# Patient Record
Sex: Female | Born: 1990 | Race: White | Hispanic: No | Marital: Single | Smoking: Current every day smoker
Health system: Southern US, Community
[De-identification: ages and names within clinical notes are randomized; demographics above are authoritative.]

## PROBLEM LIST (undated history)

## (undated) DIAGNOSIS — F32A Depression, unspecified: Secondary | ICD-10-CM

## (undated) DIAGNOSIS — N200 Calculus of kidney: Secondary | ICD-10-CM

## (undated) DIAGNOSIS — E669 Obesity, unspecified: Secondary | ICD-10-CM

## (undated) DIAGNOSIS — F329 Major depressive disorder, single episode, unspecified: Secondary | ICD-10-CM

## (undated) DIAGNOSIS — F419 Anxiety disorder, unspecified: Secondary | ICD-10-CM

## (undated) HISTORY — PX: CHOLECYSTECTOMY: SHX55

## (undated) HISTORY — PX: TONSILLECTOMY: SUR1361

## (undated) HISTORY — PX: ANKLE ARTHROPLASTY: SUR68

---

## 2015-09-23 ENCOUNTER — Encounter (HOSPITAL_BASED_OUTPATIENT_CLINIC_OR_DEPARTMENT_OTHER): Payer: Self-pay | Admitting: *Deleted

## 2015-09-23 DIAGNOSIS — M545 Low back pain: Secondary | ICD-10-CM | POA: Diagnosis present

## 2015-09-23 DIAGNOSIS — F17210 Nicotine dependence, cigarettes, uncomplicated: Secondary | ICD-10-CM | POA: Diagnosis not present

## 2015-09-23 DIAGNOSIS — N390 Urinary tract infection, site not specified: Secondary | ICD-10-CM | POA: Insufficient documentation

## 2015-09-23 NOTE — ED Notes (Signed)
Lower back pain since hiking a few days ago.  Ambulatory.  Increased pain with movement.

## 2015-09-24 ENCOUNTER — Encounter (HOSPITAL_BASED_OUTPATIENT_CLINIC_OR_DEPARTMENT_OTHER): Payer: Self-pay | Admitting: Emergency Medicine

## 2015-09-24 ENCOUNTER — Emergency Department (HOSPITAL_BASED_OUTPATIENT_CLINIC_OR_DEPARTMENT_OTHER)
Admission: EM | Admit: 2015-09-24 | Discharge: 2015-09-24 | Disposition: A | Discharge status: Home / Self Care | Payer: Medicaid Other | Attending: Emergency Medicine | Admitting: Emergency Medicine

## 2015-09-24 DIAGNOSIS — N390 Urinary tract infection, site not specified: Secondary | ICD-10-CM

## 2015-09-24 HISTORY — DX: Calculus of kidney: N20.0

## 2015-09-24 LAB — URINE MICROSCOPIC-ADD ON: RBC / HPF: NONE SEEN RBC/hpf (ref 0–5)

## 2015-09-24 LAB — URINALYSIS, ROUTINE W REFLEX MICROSCOPIC
BILIRUBIN URINE: NEGATIVE
GLUCOSE, UA: NEGATIVE mg/dL
HGB URINE DIPSTICK: NEGATIVE
Ketones, ur: NEGATIVE mg/dL
Nitrite: POSITIVE — AB
PROTEIN: NEGATIVE mg/dL
Specific Gravity, Urine: 1.022 (ref 1.005–1.030)
pH: 6.5 (ref 5.0–8.0)

## 2015-09-24 LAB — PREGNANCY, URINE: Preg Test, Ur: NEGATIVE

## 2015-09-24 MED ORDER — NAPROXEN 250 MG PO TABS
500.0000 mg | ORAL_TABLET | Freq: Once | ORAL | Status: AC
  Administered 2015-09-24: 500 mg via ORAL
  Filled 2015-09-24: qty 2

## 2015-09-24 MED ORDER — NAPROXEN 375 MG PO TABS: 375.0000 mg | ORAL_TABLET | Freq: Two times a day (BID) | ORAL | Status: DC

## 2015-09-24 MED ORDER — PHENAZOPYRIDINE HCL 100 MG PO TABS
200.0000 mg | ORAL_TABLET | Freq: Three times a day (TID) | ORAL | Status: DC
  Administered 2015-09-24: 200 mg via ORAL
  Filled 2015-09-24 (×2): qty 2

## 2015-09-24 MED ORDER — CEPHALEXIN 500 MG PO CAPS: 500.0000 mg | ORAL_CAPSULE | Freq: Four times a day (QID) | ORAL | Status: DC

## 2015-09-24 MED ORDER — PHENAZOPYRIDINE HCL 200 MG PO TABS: 200.0000 mg | ORAL_TABLET | Freq: Three times a day (TID) | ORAL | Status: DC

## 2015-09-24 NOTE — ED Notes (Signed)
Pt describes low back pain "dull throb" off and on for about a week. Worse past few days. +nausea

## 2015-09-24 NOTE — ED Notes (Signed)
Information given to patient regarding primary care options.

## 2015-09-24 NOTE — ED Provider Notes (Signed)
CSN: [id number]     Arrival date & time 09/23/15  2316 History   First MD Initiated Contact with Patient 09/24/15 0225     Chief Complaint  Patient presents with  . Back Pain     (Consider location/radiation/quality/duration/timing/severity/associated sxs/prior Treatment) Patient is a 25 y.o. female presenting with back pain. The history is provided by the patient.  Back Pain Location:  Sacro-iliac joint Quality:  Aching Radiates to:  Does not radiate Pain severity:  Moderate Pain is:  Same all the time Onset quality:  Gradual Timing:  Constant Progression:  Unchanged Chronicity:  New Context: not MCA, not MVA, not recent illness and not recent injury   Relieved by:  Nothing Worsened by:  Nothing tried Ineffective treatments:  None tried Associated symptoms: no abdominal pain, no abdominal swelling, no bladder incontinence, no bowel incontinence, no chest pain, no dysuria, no fever, no headaches, no leg pain, no numbness, no paresthesias, no pelvic pain, no perianal numbness, no tingling, no weakness and no weight loss   Risk factors: no hx of cancer     Past Medical History  Diagnosis Date  . Kidney stone    Past Surgical History  Procedure Laterality Date  . Tonsillectomy    . Cholecystectomy    . Cesarean section     History reviewed. No pertinent family history. Social History  Substance Use Topics  . Smoking status: Current Every Day Smoker -- 0.50 packs/day    Types: Cigarettes  . Smokeless tobacco: None  . Alcohol Use: No   OB History    No data available     Review of Systems  Constitutional: Negative for fever and weight loss.  Cardiovascular: Negative for chest pain.  Gastrointestinal: Negative for abdominal pain and bowel incontinence.  Genitourinary: Negative for bladder incontinence, dysuria and pelvic pain.  Musculoskeletal: Positive for back pain.  Neurological: Negative for tingling, weakness, numbness, headaches and paresthesias.  All other  systems reviewed and are negative.     Allergies  Review of patient's allergies indicates no known allergies.  Home Medications   Prior to Admission medications   Not on File   BP 96/53 mmHg  Pulse 72  Temp(Src) 98.1 F (36.7 C) (Oral)  Resp 18  Ht 5\' 5"  (1.651 m)  Wt 280 lb (127.007 kg)  BMI 46.59 kg/m2  SpO2 100%  LMP 08/26/2015 Physical Exam  Constitutional: She is oriented to person, place, and time. She appears well-developed and well-nourished. No distress.  HENT:  Head: Normocephalic and atraumatic.  Mouth/Throat: Oropharynx is clear and moist.  Eyes: Conjunctivae are normal. Pupils are equal, round, and reactive to light.  Neck: Normal range of motion. Neck supple.  Cardiovascular: Normal rate, regular rhythm and intact distal pulses.   Pulmonary/Chest: Effort normal and breath sounds normal. No respiratory distress. She has no wheezes. She has no rales.  Abdominal: Soft. Bowel sounds are normal. There is no tenderness. There is no rebound and no guarding.  Musculoskeletal: Normal range of motion.  Neurological: She is alert and oriented to person, place, and time.  Skin: Skin is warm and dry.  Psychiatric: She has a normal mood and affect.    ED Course  Procedures (including critical care time) Labs Review Labs Reviewed  URINALYSIS, ROUTINE W REFLEX MICROSCOPIC (NOT AT [facility]) - Abnormal; Notable for the following:    APPearance CLOUDY (*)    Nitrite POSITIVE (*)    Leukocytes, UA LARGE (*)    All other components within normal limits  URINE MICROSCOPIC-ADD ON - Abnormal; Notable for the following:    Squamous Epithelial / LPF 6-30 (*)    Bacteria, UA MANY (*)    All other components within normal limits  PREGNANCY, URINE    Imaging Review No results found. I have personally reviewed and evaluated these images and lab results as part of my medical decision-making.   EKG Interpretation None      MDM   Final diagnoses:  UTI (lower urinary tract  infection)   Filed Vitals:   09/24/15 0214 09/24/15 0240  BP: 93/41 96/53  Pulse: 70 72  Temp: 98.1 F (36.7 C) 98.1 F (36.7 C)  Resp: 18 18    Results for orders placed or performed during the hospital encounter of 09/24/15  Urinalysis, Routine w reflex microscopic (not at [facility])  Result Value Ref Range   Color, Urine YELLOW YELLOW   APPearance CLOUDY (A) CLEAR   Specific Gravity, Urine 1.022 1.005 - 1.030   pH 6.5 5.0 - 8.0   Glucose, UA NEGATIVE NEGATIVE mg/dL   Hgb urine dipstick NEGATIVE NEGATIVE   Bilirubin Urine NEGATIVE NEGATIVE   Ketones, ur NEGATIVE NEGATIVE mg/dL   Protein, ur NEGATIVE NEGATIVE mg/dL   Nitrite POSITIVE (A) NEGATIVE   Leukocytes, UA LARGE (A) NEGATIVE  Pregnancy, urine  Result Value Ref Range   Preg Test, Ur NEGATIVE NEGATIVE  Urine microscopic-add on  Result Value Ref Range   Squamous Epithelial / LPF 6-30 (A) NONE SEEN   WBC, UA 6-30 0 - 5 WBC/hpf   RBC / HPF NONE SEEN 0 - 5 RBC/hpf   Bacteria, UA MANY (A) NONE SEEN   No results found.  Medications  phenazopyridine (PYRIDIUM) tablet 200 mg (200 mg Oral Given 09/24/15 0246)  naproxen (NAPROSYN) tablet 500 mg (not administered)    UTI:  Stable for discharge at this time with close follow up.  Strict return precautions given      Alayla Dethlefs, MD 09/24/15 [phone]

## 2016-12-26 ENCOUNTER — Encounter (HOSPITAL_COMMUNITY): Payer: Self-pay | Admitting: *Deleted

## 2016-12-26 ENCOUNTER — Emergency Department (HOSPITAL_COMMUNITY)
Admission: EM | Admit: 2016-12-26 | Discharge: 2016-12-26 | Disposition: A | Discharge status: Home / Self Care | Payer: Medicaid Other | Attending: Emergency Medicine | Admitting: Emergency Medicine

## 2016-12-26 ENCOUNTER — Emergency Department (HOSPITAL_COMMUNITY): Payer: Medicaid Other

## 2016-12-26 DIAGNOSIS — M25572 Pain in left ankle and joints of left foot: Secondary | ICD-10-CM | POA: Insufficient documentation

## 2016-12-26 DIAGNOSIS — R600 Localized edema: Secondary | ICD-10-CM | POA: Diagnosis not present

## 2016-12-26 DIAGNOSIS — Z96662 Presence of left artificial ankle joint: Secondary | ICD-10-CM | POA: Insufficient documentation

## 2016-12-26 DIAGNOSIS — Z79899 Other long term (current) drug therapy: Secondary | ICD-10-CM | POA: Insufficient documentation

## 2016-12-26 DIAGNOSIS — G8929 Other chronic pain: Secondary | ICD-10-CM | POA: Diagnosis not present

## 2016-12-26 DIAGNOSIS — F17210 Nicotine dependence, cigarettes, uncomplicated: Secondary | ICD-10-CM | POA: Diagnosis not present

## 2016-12-26 MED ORDER — MELOXICAM 15 MG PO TABS: 15.0000 mg | ORAL_TABLET | Freq: Every day | ORAL | 0 refills | Status: DC

## 2016-12-26 MED ORDER — IBUPROFEN 200 MG PO TABS
600.0000 mg | ORAL_TABLET | Freq: Once | ORAL | Status: AC
  Administered 2016-12-26: 600 mg via ORAL
  Filled 2016-12-26: qty 1

## 2016-12-26 NOTE — Discharge Instructions (Signed)
Please read attached information regarding your condition, rice therapy and ankle range of motion exercises. Weight-bear as tolerated. Use ankle brace as directed. Return to ED for worsening pain, increased swelling of joint, redness of the joint, fevers, numbness, injury or falls.

## 2016-12-26 NOTE — Progress Notes (Signed)
Orthopedic Tech Progress Note Patient Details:  Lenox PondsKendre Spieler 08/24/1990 [id number]  Ortho Devices Type of Ortho Device: ASO Ortho Device/Splint Location: applied ASO to pt left ankle/foot.  pt tolerated application well.  Left foot.  Ortho Device/Splint Interventions: Application, Adjustment   Alvina ChouWilliams, Josph Norfleet C 12/26/2016, 9:24 PM

## 2016-12-26 NOTE — ED Provider Notes (Signed)
[facility]-EMERGENCY DEPT Provider Note   CSN: [id number] Arrival date & time: 12/26/16  1726     History   Chief Complaint No chief complaint on file.   HPI Kelly Webb is a 26 y.o. female.  HPI Patient, with a past medical history of left ankle fracture requiring surgery 11 months ago, presents to ED for left ankle pain for the past day. She states that her ankle has been giving her pain on and off since the surgery but it has worsened today. She is unsure if this is related to running around at work longer than usual yesterday. She has tried ibuprofen once with no relief in her symptoms. She has been walking with a walker which she has to use intermittently since the surgery. She denies any injury, falls, fever, chills, redness of the joint, numbness.   Past Medical History:  Diagnosis Date  . Kidney stone     There are no active problems to display for this patient.   Past Surgical History:  Procedure Laterality Date  . ANKLE ARTHROPLASTY Left   . CESAREAN SECTION    . CHOLECYSTECTOMY    . TONSILLECTOMY      OB History    No data available       Home Medications    Prior to Admission medications   Medication Sig Start Date End Date Taking? Authorizing Provider  cephALEXin (KEFLEX) 500 MG capsule Take 1 capsule (500 mg total) by mouth 4 (four) times daily. 09/24/15   Palumbo, April, MD  meloxicam (MOBIC) 15 MG tablet Take 1 tablet (15 mg total) by mouth daily. 12/26/16   Quinten Allerton, PA-C  naproxen (NAPROSYN) 375 MG tablet Take 1 tablet (375 mg total) by mouth 2 (two) times daily. 09/24/15   Palumbo, April, MD  phenazopyridine (PYRIDIUM) 200 MG tablet Take 1 tablet (200 mg total) by mouth 3 (three) times daily. 09/24/15   Palumbo, April, MD    Family History No family history on file.  Social History Social History  Substance Use Topics  . Smoking status: Current Every Day Smoker    Packs/day: 0.50    Types: Cigarettes  . Smokeless tobacco: Not on file  . Alcohol  use No     Allergies   Patient has no known allergies.   Review of Systems Review of Systems  Constitutional: Negative for chills and fever.  Musculoskeletal: Positive for arthralgias and joint swelling. Negative for back pain, gait problem and myalgias.  Skin: Negative for color change, rash and wound.  Neurological: Negative for numbness.     Physical Exam Updated Vital Signs BP 111/76 (BP Location: Left Arm)   Pulse 93   Temp 97.8 F (36.6 C) (Oral)   Resp 14   LMP 12/11/2016 (Approximate)   SpO2 97%   Physical Exam  Constitutional: She appears well-developed and well-nourished. No distress.  HENT:  Head: Normocephalic and atraumatic.  Eyes: Conjunctivae and EOM are normal. No scleral icterus.  Neck: Normal range of motion.  Pulmonary/Chest: Effort normal. No respiratory distress.  Musculoskeletal: Normal range of motion. She exhibits edema and tenderness. She exhibits no deformity.       Feet:  Tenderness to palpation of the left ankle at the indicated area. There is mild edema noted. There is no color or temperature changes noted. Sensation intact to light touch. 2+ DP pulse. Full active range of motion of joint.  Neurological: She is alert.  Skin: No rash noted. She is not diaphoretic.  Psychiatric: She has  a normal mood and affect.  Nursing note and vitals reviewed.    ED Treatments / Results  Labs (all labs ordered are listed, but only abnormal results are displayed) Labs Reviewed - No data to display  EKG  EKG Interpretation None       Radiology Dg Ankle Complete Left  Result Date: 12/26/2016 CLINICAL DATA:  Left ankle pain.  Surgery 10 months prior. EXAM: LEFT ANKLE COMPLETE - 3+ VIEW COMPARISON:  Radiograph 11/08/2016 FINDINGS: No change from prior exam. Lateral plate and multi screw fixation of remote distal fibular fracture with additional syndesmotic tight rope. Two screws traverse remote medial malleolus fracture. Surgical hardware is intact.  Osseous fragment adjacent to the posterolateral tibia is chronic. Overall alignment is unchanged with mild widening of the lateral clear space. Well corticated osseous density distal to the fibular tip is unchanged. No tibiotalar joint effusion. No focal soft tissue abnormality. IMPRESSION: Postsurgical change with intact hardware and unchanged alignment from prior exam. No acute abnormality. Electronically Signed   By: Rubye Oaks M.D.   On: 12/26/2016 18:57    Procedures Procedures (including critical care time)  Medications Ordered in ED Medications  ibuprofen (ADVIL,MOTRIN) tablet 600 mg (not administered)     Initial Impression / Assessment and Plan / ED Course  I have reviewed the triage vital signs and the nursing notes.  Pertinent labs & imaging results that were available during my care of the patient were reviewed by me and considered in my medical decision making (see chart for details).     Patient presents to ED for evaluation of left ankle pain. She did have surgery for fracture in this ankle about 11 months ago. She reports intermittent pain since the surgery. She denies any new injury to ankle or falls. She is unsure if this is related to overuse of the ankle. On physical exam there is mild edema and tenderness present of the left ankle medially and laterally. There is no color or temperature change that would concern me for septic joint also considering that patient has active range of motion of ankle. She is afebrile. No history of fever. X-ray of ankle returned as negative for acute abnormality and normal alignment of hardware. We'll give ankle brace and Mobic to be taken. Patient states that she has a walker and boot at home that she will use. Patient appears stable for discharge at this time. Strict return precautions given.  Final Clinical Impressions(s) / ED Diagnoses   Final diagnoses:  Chronic pain of left ankle    New Prescriptions New Prescriptions    MELOXICAM (MOBIC) 15 MG TABLET    Take 1 tablet (15 mg total) by mouth daily.     Dietrich Pates, PA-C 12/26/16 1610    Linwood Dibbles, MD 12/26/16 [phone]

## 2016-12-26 NOTE — ED Triage Notes (Signed)
Pt c/o L ankle pain onset yesterday without new injury, pt reports hx of sx on the L ankle 11/17, pt ambulatory with pain, A&o x4

## 2017-02-23 ENCOUNTER — Emergency Department (HOSPITAL_BASED_OUTPATIENT_CLINIC_OR_DEPARTMENT_OTHER): Payer: Medicaid Other

## 2017-02-23 ENCOUNTER — Other Ambulatory Visit: Payer: Self-pay

## 2017-02-23 ENCOUNTER — Encounter (HOSPITAL_BASED_OUTPATIENT_CLINIC_OR_DEPARTMENT_OTHER): Payer: Self-pay | Admitting: Emergency Medicine

## 2017-02-23 ENCOUNTER — Emergency Department (HOSPITAL_BASED_OUTPATIENT_CLINIC_OR_DEPARTMENT_OTHER)
Admission: EM | Admit: 2017-02-23 | Discharge: 2017-02-24 | Disposition: A | Discharge status: Home / Self Care | Payer: Medicaid Other | Attending: Emergency Medicine | Admitting: Emergency Medicine

## 2017-02-23 DIAGNOSIS — Z96662 Presence of left artificial ankle joint: Secondary | ICD-10-CM | POA: Insufficient documentation

## 2017-02-23 DIAGNOSIS — M25572 Pain in left ankle and joints of left foot: Secondary | ICD-10-CM | POA: Insufficient documentation

## 2017-02-23 DIAGNOSIS — F17210 Nicotine dependence, cigarettes, uncomplicated: Secondary | ICD-10-CM | POA: Insufficient documentation

## 2017-02-23 DIAGNOSIS — Z79899 Other long term (current) drug therapy: Secondary | ICD-10-CM | POA: Diagnosis not present

## 2017-02-23 NOTE — ED Triage Notes (Signed)
PT presents with c/o of pain to left ankle after a fall tonight

## 2017-02-24 LAB — PREGNANCY, URINE: PREG TEST UR: NEGATIVE

## 2017-02-24 MED ORDER — HYDROCODONE-ACETAMINOPHEN 5-325 MG PO TABS
1.0000 | ORAL_TABLET | Freq: Once | ORAL | Status: AC
  Administered 2017-02-24: 1 via ORAL
  Filled 2017-02-24: qty 1

## 2017-02-24 MED ORDER — IBUPROFEN 600 MG PO TABS: 600.0000 mg | ORAL_TABLET | Freq: Four times a day (QID) | ORAL | 0 refills | Status: DC | PRN

## 2017-02-24 NOTE — ED Notes (Addendum)
Pt discharged to home with family. NAD.  

## 2017-02-24 NOTE — ED Provider Notes (Addendum)
[facility] EMERGENCY DEPARTMENT Provider Note   CSN: [id number] Arrival date & time: 02/23/17  2330     History   Chief Complaint Chief Complaint  Patient presents with  . Ankle Pain    HPI Kelly Webb is a 26 y.o. female.  HPI 26 year old female with history of ankle surgery on the left side comes in with chief complaint of left ankle injury. Patient reports that she tripped and fell prior to emergency room arrival.  Patient heard a loud pop during her fall.  Patient was unable to get up and put any weight.  Patient also had immediate numbness to the entire foot after the fall, however over time her sensation is returning and now she has more tingling sensation to her foot and distal leg.  Patient had ankle surgery on the ipsilateral side [facility] few years back [facility].  Past Medical History:  Diagnosis Date  . Kidney stone     There are no active problems to display for this patient.   Past Surgical History:  Procedure Laterality Date  . ANKLE ARTHROPLASTY Left   . CESAREAN SECTION    . CHOLECYSTECTOMY    . TONSILLECTOMY      OB History    No data available       Home Medications    Prior to Admission medications   Medication Sig Start Date End Date Taking? Authorizing Provider  cephALEXin (KEFLEX) 500 MG capsule Take 1 capsule (500 mg total) by mouth 4 (four) times daily. 09/24/15   Palumbo, April, MD  meloxicam (MOBIC) 15 MG tablet Take 1 tablet (15 mg total) by mouth daily. 12/26/16   Khatri, Hina, PA-C  naproxen (NAPROSYN) 375 MG tablet Take 1 tablet (375 mg total) by mouth 2 (two) times daily. 09/24/15   Palumbo, April, MD  phenazopyridine (PYRIDIUM) 200 MG tablet Take 1 tablet (200 mg total) by mouth 3 (three) times daily. 09/24/15   Palumbo, April, MD    Family History No family history on file.  Social History Social History   Tobacco Use  . Smoking status: Current Every Day Smoker    Packs/day: 0.50    Types:  Cigarettes  Substance Use Topics  . Alcohol use: No  . Drug use: No     Allergies   Patient has no known allergies.   Review of Systems Review of Systems  Constitutional: Positive for activity change.  Musculoskeletal: Positive for arthralgias, joint swelling and myalgias.  Skin: Negative for wound.  Hematological: Does not bruise/bleed easily.     Physical Exam Updated Vital Signs BP 116/65 (BP Location: Right Arm)   Pulse 98   Temp 98.4 F (36.9 C) (Oral)   Resp 18   LMP 01/06/2017   SpO2 98%   Physical Exam  Constitutional: She is oriented to person, place, and time. She appears well-developed.  HENT:  Head: Atraumatic.  Eyes: EOM are normal.  Neck: Neck supple.  Cardiovascular: Normal rate and intact distal pulses.  Pulmonary/Chest: Effort normal.  Abdominal: Bowel sounds are normal.  Musculoskeletal: She exhibits edema and tenderness. She exhibits no deformity.  Left ankle has gross swelling, most noticeable over the anteromedial aspect of the ankle and the proximal foot. She has tenderness to palpation over the medial malleolar region. No laxity with eversion/inversion/plantar and dorsiflexion. Tenderness is worse with eversion and with dorsiflexion.  Neurological: She is alert and oriented to person, place, and time.  Tingling to the L foot  Skin: Skin  is warm and dry.  Nursing note and vitals reviewed.    ED Treatments / Results  Labs (all labs ordered are listed, but only abnormal results are displayed) Labs Reviewed  PREGNANCY, URINE    EKG  EKG Interpretation None       Radiology Dg Ankle Complete Left  Result Date: 02/24/2017 CLINICAL DATA:  Slip and fall injury this evening with injury to the left ankle. Heard a pop. Medial pain. Unable to bear weight. EXAM: LEFT ANKLE COMPLETE - 3+ VIEW COMPARISON:  12/26/2016 FINDINGS: Postoperative changes with plate and screw fixation of the distal fibula and a screw fixation of the medial  malleolus. Healed fracture deformities. 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 Degenerative changes in the tibiotalar joint. No evidence of acute fracture or dislocation. No significant changes since the previous study. Soft tissues are unremarkable. IMPRESSION: Postoperative changes with internal fixation of old fracture deformities of the distal tibia and fibula. 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 No change in appearance since previous study. No acute bony abnormalities identified. Electronically Signed   By: Burman NievesWilliam  Stevens M.D.   On: 02/24/2017 00:06    Procedures Procedures (including critical care time)  Medications Ordered in ED Medications  HYDROcodone-acetaminophen (NORCO/VICODIN) 5-325 MG per tablet 1 tablet (1 tablet Oral Given 02/24/17 0044)     Initial Impression / Assessment and Plan / ED Course  I have reviewed the triage vital signs and the nursing notes.  Pertinent labs & imaging results that were available during my care of the patient were reviewed by me and considered in my medical decision making (see chart for details).    Patient comes in with chief complaint of left ankle injury.  Patient has history of surgical repair to that ankle.  Patient is noted to be tender over the medial malleoli region.  X-ray does not show any clear evidence of fracture.  Patient is having some tingling likely due to neuropraxia from the injury.  Patient heard a loud pop as well which means she likely has ligamentous injury.  We will place patient in an ASO splint, and provide crutches.  Partial weight bearing only if able to tolerate it has been recommended.   Final Clinical Impressions(s) / ED Diagnoses   Final diagnoses:  Acute left ankle pain    ED Discharge Orders    None       Derwood KaplanNanavati, Elliannah Wayment, MD 02/24/17 [id number]    Derwood KaplanNanavati, Abbie Jablon, MD 02/24/17 [phone]

## 2017-02-24 NOTE — Discharge Instructions (Signed)
X-ray of the ankle does not show any acute fracture.  We suspect that you likely have ligamentous and tendon injury.  Small hairline fracture could also be missed on initial assessment, therefore it is important that you follow-up with an orthopedic doctor. We would prefer that you see the Orthopedist who did the surgery.  Rest and elevate the affected painful area.  Apply cold compresses 4 times a day for 10 minutes each.  As pain recedes, begin normal activities slowly as tolerated.  Use crutches to for now, and allow for partial weight bearing on your left foot as tolerated.

## 2017-07-23 ENCOUNTER — Other Ambulatory Visit: Payer: Self-pay

## 2017-07-23 ENCOUNTER — Emergency Department (HOSPITAL_COMMUNITY)
Admission: EM | Admit: 2017-07-23 | Discharge: 2017-07-24 | Disposition: A | Discharge status: Home / Self Care | Payer: Medicaid Other | Attending: Emergency Medicine | Admitting: Emergency Medicine

## 2017-07-23 ENCOUNTER — Encounter (HOSPITAL_COMMUNITY): Payer: Self-pay | Admitting: Emergency Medicine

## 2017-07-23 ENCOUNTER — Ambulatory Visit (HOSPITAL_COMMUNITY)
Admission: EM | Admit: 2017-07-23 | Discharge: 2017-07-23 | Disposition: A | Discharge status: Home / Self Care | Payer: Medicaid Other

## 2017-07-23 ENCOUNTER — Emergency Department (HOSPITAL_COMMUNITY): Payer: Medicaid Other

## 2017-07-23 DIAGNOSIS — N12 Tubulo-interstitial nephritis, not specified as acute or chronic: Secondary | ICD-10-CM

## 2017-07-23 DIAGNOSIS — N10 Acute tubulo-interstitial nephritis: Secondary | ICD-10-CM | POA: Insufficient documentation

## 2017-07-23 DIAGNOSIS — R1031 Right lower quadrant pain: Secondary | ICD-10-CM | POA: Diagnosis present

## 2017-07-23 LAB — CBC WITH DIFFERENTIAL/PLATELET
BASOS ABS: 0 10*3/uL (ref 0.0–0.1)
BASOS PCT: 0 %
EOS ABS: 0.1 10*3/uL (ref 0.0–0.7)
EOS PCT: 0 %
HCT: 42.4 % (ref 36.0–46.0)
Hemoglobin: 14.4 g/dL (ref 12.0–15.0)
Lymphocytes Relative: 12 %
Lymphs Abs: 2.1 10*3/uL (ref 0.7–4.0)
MCH: 29.2 pg (ref 26.0–34.0)
MCHC: 34 g/dL (ref 30.0–36.0)
MCV: 86 fL (ref 78.0–100.0)
MONO ABS: 1.1 10*3/uL — AB (ref 0.1–1.0)
Monocytes Relative: 6 %
Neutro Abs: 13.9 10*3/uL — ABNORMAL HIGH (ref 1.7–7.7)
Neutrophils Relative %: 82 %
PLATELETS: 312 10*3/uL (ref 150–400)
RBC: 4.93 MIL/uL (ref 3.87–5.11)
RDW: 13.1 % (ref 11.5–15.5)
WBC: 17.1 10*3/uL — AB (ref 4.0–10.5)

## 2017-07-23 LAB — COMPREHENSIVE METABOLIC PANEL
ALT: 28 U/L (ref 14–54)
ANION GAP: 9 (ref 5–15)
AST: 24 U/L (ref 15–41)
Albumin: 4.1 g/dL (ref 3.5–5.0)
Alkaline Phosphatase: 76 U/L (ref 38–126)
BUN: 9 mg/dL (ref 6–20)
CALCIUM: 9.4 mg/dL (ref 8.9–10.3)
CO2: 21 mmol/L — ABNORMAL LOW (ref 22–32)
Chloride: 102 mmol/L (ref 101–111)
Creatinine, Ser: 0.72 mg/dL (ref 0.44–1.00)
Glucose, Bld: 100 mg/dL — ABNORMAL HIGH (ref 65–99)
Potassium: 3.8 mmol/L (ref 3.5–5.1)
Sodium: 132 mmol/L — ABNORMAL LOW (ref 135–145)
Total Bilirubin: 1.1 mg/dL (ref 0.3–1.2)
Total Protein: 7.8 g/dL (ref 6.5–8.1)

## 2017-07-23 LAB — URINALYSIS, ROUTINE W REFLEX MICROSCOPIC
BILIRUBIN URINE: NEGATIVE
GLUCOSE, UA: NEGATIVE mg/dL
Ketones, ur: 20 mg/dL — AB
NITRITE: POSITIVE — AB
PROTEIN: 30 mg/dL — AB
Specific Gravity, Urine: 1.018 (ref 1.005–1.030)
pH: 5 (ref 5.0–8.0)

## 2017-07-23 LAB — I-STAT BETA HCG BLOOD, ED (MC, WL, AP ONLY)

## 2017-07-23 LAB — LIPASE, BLOOD: LIPASE: 22 U/L (ref 11–51)

## 2017-07-23 MED ORDER — IOPAMIDOL (ISOVUE-370) INJECTION 76%: 100.0000 mL | Freq: Once | INTRAVENOUS | Status: DC | PRN

## 2017-07-23 MED ORDER — OXYCODONE-ACETAMINOPHEN 5-325 MG PO TABS
1.0000 | ORAL_TABLET | Freq: Once | ORAL | Status: AC
  Administered 2017-07-23: 1 via ORAL
  Filled 2017-07-23: qty 1

## 2017-07-23 MED ORDER — IOPAMIDOL (ISOVUE-300) INJECTION 61%
100.0000 mL | Freq: Once | INTRAVENOUS | Status: AC | PRN
  Administered 2017-07-23: 100 mL via INTRAVENOUS

## 2017-07-23 MED ORDER — ONDANSETRON 4 MG PO TBDP
4.0000 mg | ORAL_TABLET | Freq: Once | ORAL | Status: AC
  Administered 2017-07-23: 4 mg via ORAL
  Filled 2017-07-23: qty 1

## 2017-07-23 MED ORDER — IOPAMIDOL (ISOVUE-300) INJECTION 61%
INTRAVENOUS | Status: AC
  Filled 2017-07-23: qty 100

## 2017-07-23 NOTE — ED Provider Notes (Signed)
Patient placed in Quick Look pathway, seen and evaluated   Chief Complaint: RLQ abdominal pain  HPI:   RLQ pain since this morning.  Sharp, has moved closer to umbilicus.  Reports has had mild appendicitis twice now which has been treated with antibiotics both times.    ROS: No vaginal bleeding or discharge, Denies dysuria or urgency.  (one)  Physical Exam:   Gen: No distress  Neuro: Awake and Alert  Skin: Warm    Focused Exam: abdomen is soft, obese, non distended, TTP in RLQ.  No other TTP.  Positive rebound Tenderness.     Initiation of care has begun. The patient has been counseled on the process, plan, and necessity for staying for the completion/evaluation, and the remainder of the medical screening examination    Norman ClayHammond, Davante Gerke W, PA-C 07/23/17 Cherie Dark2003    Nanavati, Ankit, MD 07/25/17 [phone]

## 2017-07-23 NOTE — ED Triage Notes (Signed)
Patient presents to ED for assessment of right lower quadrant pain waking her up from sleep this morning.  Patient states constant through out the day.  C/o nausea, denies emesis.  States hx of chronic UTIs.  Patient's concern is for her appendix.     PA at bedside at this time

## 2017-07-23 NOTE — ED Triage Notes (Signed)
Pt here for appendicitis, pt states she is concerned about this. Discussed with her she needs to go to the ER, pt agreeable to plan.

## 2017-07-23 NOTE — ED Notes (Signed)
Patient updated on delays and wait times for CT.  Ct states patient next in line.

## 2017-07-24 MED ORDER — KETOROLAC TROMETHAMINE 30 MG/ML IJ SOLN
30.0000 mg | Freq: Once | INTRAMUSCULAR | Status: AC
  Administered 2017-07-24: 30 mg via INTRAVENOUS

## 2017-07-24 MED ORDER — OXYCODONE-ACETAMINOPHEN 5-325 MG PO TABS: 1.0000 | ORAL_TABLET | Freq: Four times a day (QID) | ORAL | 0 refills | Status: DC | PRN

## 2017-07-24 MED ORDER — ONDANSETRON HCL 4 MG/2ML IJ SOLN
INTRAMUSCULAR | Status: AC
  Filled 2017-07-24: qty 2

## 2017-07-24 MED ORDER — MORPHINE SULFATE (PF) 4 MG/ML IV SOLN
INTRAVENOUS | Status: AC
  Filled 2017-07-24: qty 1

## 2017-07-24 MED ORDER — ONDANSETRON HCL 4 MG/2ML IJ SOLN
4.0000 mg | Freq: Once | INTRAMUSCULAR | Status: AC
  Administered 2017-07-24: 4 mg via INTRAVENOUS

## 2017-07-24 MED ORDER — IBUPROFEN 800 MG PO TABS: 800.0000 mg | ORAL_TABLET | Freq: Three times a day (TID) | ORAL | 0 refills | Status: DC

## 2017-07-24 MED ORDER — MORPHINE SULFATE (PF) 4 MG/ML IV SOLN
4.0000 mg | Freq: Once | INTRAVENOUS | Status: AC
  Administered 2017-07-24: 4 mg via INTRAVENOUS

## 2017-07-24 MED ORDER — SULFAMETHOXAZOLE-TRIMETHOPRIM 800-160 MG PO TABS: 1.0000 | ORAL_TABLET | Freq: Two times a day (BID) | ORAL | 0 refills | Status: AC

## 2017-07-24 MED ORDER — ONDANSETRON HCL 4 MG PO TABS: 4.0000 mg | ORAL_TABLET | Freq: Four times a day (QID) | ORAL | 0 refills | Status: DC

## 2017-07-24 MED ORDER — KETOROLAC TROMETHAMINE 30 MG/ML IJ SOLN
INTRAMUSCULAR | Status: AC
  Filled 2017-07-24: qty 1

## 2017-07-24 NOTE — Discharge Instructions (Signed)
Take Bactrim twice daily for 2 weeks as prescribed.  Make sure to finish all this medication.  Take ibuprofen every 8 hours for your pain.  Take 1-2 Percocet every 6 hours as needed for severe pain.  Take Zofran every 6 hours as needed for nausea or vomiting.  Make sure to drink plenty of fluids.  Please return to the emergency department if you develop any new or worsening symptoms.  Please follow-up and establish care with a primary care provider by calling the number circled on your discharge paperwork.

## 2017-07-26 LAB — URINE CULTURE: Culture: >=100000 — AB

## 2017-07-27 ENCOUNTER — Telehealth: Payer: Self-pay | Admitting: Emergency Medicine

## 2017-07-27 NOTE — Telephone Encounter (Signed)
Post ED Visit - Positive Culture Follow-up  Culture report reviewed by antimicrobial stewardship pharmacist:  []  Enzo BiNathan Batchelder, Pharm.D. []  Celedonio MiyamotoJeremy Frens, Pharm.D., BCPS AQ-ID []  Garvin FilaMike Maccia, Pharm.D., BCPS []  Georgina PillionElizabeth Martin, Pharm.D., BCPS []  StrumMinh Pham, [address]Pharm[address]D., BCPS, AAHIVP []  Estella HuskMichelle Turner, Pharm.D., BCPS, AAHIVP []  Lysle Pearlachel Rumbarger, PharmD, BCPS []  Blake DivineShannon Parkey, PharmD []  Pollyann SamplesAndy Johnston, PharmD, BCPS Sharin MonsEmily Sinclair PharmD  Positive Urine culture Treated with sulfamethoxazole-trimethoprim , organism sensitive to the same and no further patient follow-up is required at this time.  Berle MullMiller, Dakari Stabler 07/27/2017, 10:18 AM

## 2017-12-12 ENCOUNTER — Encounter (HOSPITAL_COMMUNITY): Payer: Self-pay | Admitting: *Deleted

## 2017-12-12 ENCOUNTER — Inpatient Hospital Stay (HOSPITAL_COMMUNITY)
Admission: AD | Admit: 2017-12-12 | Discharge: 2017-12-12 | Disposition: A | Discharge status: Home / Self Care | Payer: Medicaid Other | Source: Ambulatory Visit | Attending: Obstetrics & Gynecology | Admitting: Obstetrics & Gynecology

## 2017-12-12 ENCOUNTER — Inpatient Hospital Stay (HOSPITAL_COMMUNITY): Payer: Medicaid Other

## 2017-12-12 DIAGNOSIS — O021 Missed abortion: Secondary | ICD-10-CM | POA: Insufficient documentation

## 2017-12-12 DIAGNOSIS — N939 Abnormal uterine and vaginal bleeding, unspecified: Secondary | ICD-10-CM | POA: Diagnosis present

## 2017-12-12 DIAGNOSIS — O26891 Other specified pregnancy related conditions, first trimester: Secondary | ICD-10-CM | POA: Diagnosis present

## 2017-12-12 DIAGNOSIS — O4690 Antepartum hemorrhage, unspecified, unspecified trimester: Secondary | ICD-10-CM

## 2017-12-12 DIAGNOSIS — Z3A01 Less than 8 weeks gestation of pregnancy: Secondary | ICD-10-CM

## 2017-12-12 HISTORY — DX: Major depressive disorder, single episode, unspecified: F32.9

## 2017-12-12 HISTORY — DX: Anxiety disorder, unspecified: F41.9

## 2017-12-12 HISTORY — DX: Depression, unspecified: F32.A

## 2017-12-12 HISTORY — DX: Obesity, unspecified: E66.9

## 2017-12-12 LAB — URINALYSIS, ROUTINE W REFLEX MICROSCOPIC
BILIRUBIN URINE: NEGATIVE
GLUCOSE, UA: NEGATIVE mg/dL
KETONES UR: NEGATIVE mg/dL
Nitrite: POSITIVE — AB
PH: 6 (ref 5.0–8.0)
Protein, ur: NEGATIVE mg/dL
Specific Gravity, Urine: 1.018 (ref 1.005–1.030)

## 2017-12-12 LAB — CBC
HCT: 39.2 % (ref 36.0–46.0)
Hemoglobin: 13.3 g/dL (ref 12.0–15.0)
MCH: 29.7 pg (ref 26.0–34.0)
MCHC: 33.9 g/dL (ref 30.0–36.0)
MCV: 87.5 fL (ref 78.0–100.0)
PLATELETS: 335 10*3/uL (ref 150–400)
RBC: 4.48 MIL/uL (ref 3.87–5.11)
RDW: 13.4 % (ref 11.5–15.5)
WBC: 14.7 10*3/uL — ABNORMAL HIGH (ref 4.0–10.5)

## 2017-12-12 LAB — ABO/RH: ABO/RH(D): O POS

## 2017-12-12 LAB — POCT PREGNANCY, URINE: Preg Test, Ur: POSITIVE — AB

## 2017-12-12 NOTE — MAU Note (Signed)
Some pink spotting for couple days. More spotting this am that is dark red with lower abd pain and lower back pain

## 2017-12-12 NOTE — Discharge Instructions (Signed)
Return to care  °· If you have heavier bleeding that soaks through more that 2 pads per hour for an hour or more °· If you bleed so much that you feel like you might pass out or you do pass out °· If you have significant abdominal pain that is not improved with Tylenol  °· If you develop a fever > 100.5 ° ° ° ° °Miscarriage °A miscarriage is the sudden loss of an unborn baby (fetus) before the 20th week of pregnancy. Most miscarriages happen in the first 3 months of pregnancy. Sometimes, it happens before a woman even knows she is pregnant. A miscarriage is also called a "spontaneous miscarriage" or "early pregnancy loss." Having a miscarriage can be an emotional experience. Talk with your caregiver about any questions you may have about miscarrying, the grieving process, and your future pregnancy plans. °What are the causes? °· Problems with the fetal chromosomes that make it impossible for the baby to develop normally. Problems with the baby's genes or chromosomes are most often the result of errors that occur, by chance, as the embryo divides and grows. The problems are not inherited from the parents. °· Infection of the cervix or uterus. °· Hormone problems. °· Problems with the cervix, such as having an incompetent cervix. This is when the tissue in the cervix is not strong enough to hold the pregnancy. °· Problems with the uterus, such as an abnormally shaped uterus, uterine fibroids, or congenital abnormalities. °· Certain medical conditions. °· Smoking, drinking alcohol, or taking illegal drugs. °· Trauma. °Often, the cause of a miscarriage is unknown. °What are the signs or symptoms? °· Vaginal bleeding or spotting, with or without cramps or pain. °· Pain or cramping in the abdomen or lower back. °· Passing fluid, tissue, or blood clots from the vagina. °How is this diagnosed? °Your caregiver will perform a physical exam. You may also have an ultrasound to confirm the miscarriage. Blood or urine tests may  also be ordered. °How is this treated? °· Sometimes, treatment is not necessary if you naturally pass all the fetal tissue that was in the uterus. If some of the fetus or placenta remains in the body (incomplete miscarriage), tissue left behind may become infected and must be removed. Usually, a dilation and curettage (D and C) procedure is performed. During a D and C procedure, the cervix is widened (dilated) and any remaining fetal or placental tissue is gently removed from the uterus. °· Antibiotic medicines are prescribed if there is an infection. Other medicines may be given to reduce the size of the uterus (contract) if there is a lot of bleeding. °· If you have Rh negative blood and your baby was Rh positive, you will need a Rh immunoglobulin shot. This shot will protect any future baby from having Rh blood problems in future pregnancies. °Follow these instructions at home: °· Your caregiver may order bed rest or may allow you to continue light activity. Resume activity as directed by your caregiver. °· Have someone help with home and family responsibilities during this time. °· Keep track of the number of sanitary pads you use each day and how soaked (saturated) they are. Write down this information. °· Do not use tampons. Do not douche or have sexual intercourse until approved by your caregiver. °· Only take over-the-counter or prescription medicines for pain or discomfort as directed by your caregiver. °· Do not take aspirin. Aspirin can cause bleeding. °· Keep all follow-up appointments with your caregiver. °·   If you or your partner have problems with grieving, talk to your caregiver or seek counseling to help cope with the pregnancy loss. Allow enough time to grieve before trying to get pregnant again. °Get help right away if: °· You have severe cramps or pain in your back or abdomen. °· You have a fever. °· You pass large blood clots (walnut-sized or larger) or tissue from your vagina. Save any tissue  for your caregiver to inspect. °· Your bleeding increases. °· You have a thick, bad-smelling vaginal discharge. °· You become lightheaded, weak, or you faint. °· You have chills. °This information is not intended to replace advice given to you by your health care provider. Make sure you discuss any questions you have with your health care provider. °Document Released: 10/01/2000 Document Revised: 09/13/2015 Document Reviewed: 05/27/2011 °Elsevier Interactive Patient Education © 2017 Elsevier Inc. ° °

## 2017-12-12 NOTE — Progress Notes (Signed)
L. Leftwich-Kirby CNM notified of pt's admission and status. CNM will put in orders for labs and u/s

## 2017-12-12 NOTE — MAU Provider Note (Signed)
Chief Complaint: Vaginal Bleeding; Back Pain; and Abdominal Pain   None    SUBJECTIVE HPI: Kelly Webb is a 27 y.o. G3P2002 at [redacted]w[redacted]d who presents to Maternity Admissions reporting abdominal cramping & vaginal bleeding. Had ultrasound at Dr. Tawni Levy office last week that confirmed IUP with heartbeat.  Symptoms began 2 days ago. Reports dark red spotting on toilet paper. Not saturating pads or passing clots. Lower abdominal cramping that radiates to low back.   Location: abdomen & back Quality: cramping Severity: 6/10 on pain scale Duration: 2 days Timing: intermittent Modifying factors: none Associated signs and symptoms: vaginal bleeding  Past Medical History:  Diagnosis Date  . Anxiety   . Depression   . Kidney stone   . Obesity    OB History  Gravida Para Term Preterm AB Living  3 2 2     2   SAB TAB Ectopic Multiple Live Births          2    # Outcome Date GA Lbr Len/2nd Weight Sex Delivery Anes PTL Lv  3 Current           2 Term 11/23/12    F CS-LTranv   LIV  1 Term 12/12/10    F Vag-Spont   LIV   Past Surgical History:  Procedure Laterality Date  . ANKLE ARTHROPLASTY Left   . CESAREAN SECTION    . CHOLECYSTECTOMY    . TONSILLECTOMY     Social History   Socioeconomic History  . Marital status: Single    Spouse name: Not on file  . Number of children: Not on file  . Years of education: Not on file  . Highest education level: Not on file  Occupational History  . Not on file  Social Needs  . Financial resource strain: Not on file  . Food insecurity:    Worry: Not on file    Inability: Not on file  . Transportation needs:    Medical: Not on file    Non-medical: Not on file  Tobacco Use  . Smoking status: Current Every Day Smoker    Packs/day: 0.50    Types: Cigarettes  . Smokeless tobacco: Never Used  Substance and Sexual Activity  . Alcohol use: No  . Drug use: No  . Sexual activity: Not on file  Lifestyle  . Physical activity:    Days per week:  Not on file    Minutes per session: Not on file  . Stress: Not on file  Relationships  . Social connections:    Talks on phone: Not on file    Gets together: Not on file    Attends religious service: Not on file    Active member of club or organization: Not on file    Attends meetings of clubs or organizations: Not on file    Relationship status: Not on file  . Intimate partner violence:    Fear of current or ex partner: Not on file    Emotionally abused: Not on file    Physically abused: Not on file    Forced sexual activity: Not on file  Other Topics Concern  . Not on file  Social History Narrative  . Not on file   History reviewed. No pertinent family history. No current facility-administered medications on file prior to encounter.    No current outpatient medications on file prior to encounter.   No Known Allergies  I have reviewed patient's Past Medical Hx, Surgical Hx, Family Hx, Social Hx, medications  and allergies.   Review of Systems  Constitutional: Negative.   Gastrointestinal: Positive for abdominal pain.  Genitourinary: Positive for vaginal bleeding. Negative for dysuria, flank pain and frequency.  Musculoskeletal: Positive for back pain.    OBJECTIVE Patient Vitals for the past 24 hrs:  BP Pulse Resp Height Weight  12/12/17 0928 (!) 144/98 (!) 101 20 - -  12/12/17 0624 (!) 145/92 (!) 105 18 5\' 5"  (1.651 m) 128.4 kg   Constitutional: Well-developed, well-nourished female in no acute distress.  Cardiovascular: normal rate & rhythm, no murmur Respiratory: normal rate and effort. Lung sounds clear throughout GI: Abd soft, non-tender, Pos BS x 4. No guarding or rebound tenderness. No CVAT MS: Extremities nontender, no edema, normal ROM Neurologic: Alert and oriented x 4.     LAB RESULTS Results for orders placed or performed during the hospital encounter of 12/12/17 (from the past 24 hour(s))  Urinalysis, Routine w reflex microscopic     Status: Abnormal    Collection Time: 12/12/17  6:42 AM  Result Value Ref Range   Color, Urine YELLOW YELLOW   APPearance HAZY (A) CLEAR   Specific Gravity, Urine 1.018 1.005 - 1.030   pH 6.0 5.0 - 8.0   Glucose, UA NEGATIVE NEGATIVE mg/dL   Hgb urine dipstick MODERATE (A) NEGATIVE   Bilirubin Urine NEGATIVE NEGATIVE   Ketones, ur NEGATIVE NEGATIVE mg/dL   Protein, ur NEGATIVE NEGATIVE mg/dL   Nitrite POSITIVE (A) NEGATIVE   Leukocytes, UA SMALL (A) NEGATIVE   RBC / HPF 0-5 0 - 5 RBC/hpf   WBC, UA 21-50 0 - 5 WBC/hpf   Bacteria, UA MANY (A) NONE SEEN   Squamous Epithelial / LPF 0-5 0 - 5   Mucus PRESENT   Pregnancy, urine POC     Status: Abnormal   Collection Time: 12/12/17  6:47 AM  Result Value Ref Range   Preg Test, Ur POSITIVE (A) NEGATIVE  CBC     Status: Abnormal   Collection Time: 12/12/17  7:27 AM  Result Value Ref Range   WBC 14.7 (H) 4.0 - 10.5 K/uL   RBC 4.48 3.87 - 5.11 MIL/uL   Hemoglobin 13.3 12.0 - 15.0 g/dL   HCT 16.1 09.6 - 04.5 %   MCV 87.5 78.0 - 100.0 fL   MCH 29.7 26.0 - 34.0 pg   MCHC 33.9 30.0 - 36.0 g/dL   RDW 40.9 81.1 - 91.4 %   Platelets 335 150 - 400 K/uL  ABO/Rh     Status: None (Preliminary result)   Collection Time: 12/12/17  7:27 AM  Result Value Ref Range   ABO/RH(D)      O POS Performed at [facility], [address] [place], [place] [address]     IMAGING US Ob Transvaginal  Result Date: 12/12/2017 CLINICAL DATA:  Vaginal bleeding in first trimester of pregnancy. EXAM: TRANSVAGINAL OB ULTRASOUND TECHNIQUE: Transvaginal ultrasound was performed for complete evaluation of the gestation as well as the maternal uterus, adnexal regions, and pelvic cul-de-sac. COMPARISON:  Ultrasound of December 05, 2017 performed at [facility]. FINDINGS: Intrauterine gestational sac: Single visualized, but abnormal in shape and positioned in lower uterine segment. Yolk sac:  Visualized. Embryo:  Visualized. Cardiac Activity: Not visualized. CRL:   9.4 mm   6  w 6 d                  [place] EDC: August 01, 2018. Subchorionic hemorrhage:  Small subchronic hemorrhage is noted. Maternal uterus/adnexae:  Ovaries are unremarkable. IMPRESSION: Crown-rump length measures 9.4 mm with fetal cardiac activity no longer visualized, although it was present on prior exam. Findings meet definitive criteria for failed pregnancy. This follows SRU consensus guidelines: Diagnostic Criteria for Nonviable Pregnancy Early in the First Trimester. Macy Mis Engl J Med [id number];369:1443-51. Electronically Signed   By: Lupita RaiderJames  Green Jr, M.D.   On: 12/12/2017 08:46    MAU COURSE Orders Placed This Encounter  Procedures  . Culture, OB Urine  . US OB Transvaginal  . Urinalysis, Routine w reflex microscopic  . CBC  . Pregnancy, urine POC  . ABO/Rh  . Discharge patient   No orders of the defined types were placed in this encounter.   MDM Per care everywhere. IUP with FHR last week. Ultrasound today shows IUP with no cardiac activity.  MBT O positive Discussed results with patient. Offered expectant management vs cytotec. Pt would like to f/u with Dr. Shawnie Ponsorn for confirmatory ultrasound as this is a much desired pregnancy.   U/a with + nitrites. Pt denies symptoms. States she always gets UTIs & doesn't want abx until urine culture results  ASSESSMENT 1. Missed abortion   2. Vaginal bleeding in pregnancy   3. Less than [redacted] weeks gestation of pregnancy     PLAN Discharge home in stable condition. Bleeding precautions Pelvic rest  Follow-up Information    Alm Bustardorn, Henry H, MD. Schedule an appointment as soon as possible for a visit.   Specialty:  Specialist Contact information: [name] [place] [id number] [phone]          Allergies as of 12/12/2017   No Known Allergies     Medication List    STOP taking these medications   ibuprofen 800 MG tablet Commonly known as:  ADVIL,MOTRIN   ondansetron 4 MG tablet Commonly known as:  ZOFRAN   oxyCODONE-acetaminophen  5-325 MG tablet Commonly known as:  PERCOCET/ROXICET        Judeth HornLawrence, Daimen Shovlin, NP 12/12/2017  4:13 PM

## 2017-12-13 ENCOUNTER — Encounter (HOSPITAL_COMMUNITY): Payer: Self-pay

## 2017-12-13 ENCOUNTER — Inpatient Hospital Stay (HOSPITAL_COMMUNITY)
Admission: AD | Admit: 2017-12-13 | Discharge: 2017-12-16 | DRG: 885 | Disposition: A | Discharge status: Home / Self Care | Payer: Medicaid Other | Source: Intra-hospital | Attending: Psychiatry | Admitting: Psychiatry

## 2017-12-13 ENCOUNTER — Encounter (HOSPITAL_COMMUNITY)
Admission: AD | Disposition: A | Discharge status: Home / Self Care | Payer: Self-pay | Source: Ambulatory Visit | Attending: Obstetrics and Gynecology

## 2017-12-13 ENCOUNTER — Inpatient Hospital Stay (HOSPITAL_COMMUNITY): Payer: Medicaid Other | Admitting: Anesthesiology

## 2017-12-13 ENCOUNTER — Inpatient Hospital Stay (HOSPITAL_COMMUNITY): Payer: Medicaid Other

## 2017-12-13 ENCOUNTER — Other Ambulatory Visit: Payer: Self-pay | Admitting: Obstetrics & Gynecology

## 2017-12-13 ENCOUNTER — Ambulatory Visit (HOSPITAL_COMMUNITY)
Admission: AD | Admit: 2017-12-13 | Discharge: 2017-12-13 | Disposition: A | Discharge status: Home / Self Care | Payer: Medicaid Other | Source: Ambulatory Visit | Attending: Obstetrics and Gynecology | Admitting: Obstetrics and Gynecology

## 2017-12-13 ENCOUNTER — Other Ambulatory Visit: Payer: Self-pay

## 2017-12-13 ENCOUNTER — Encounter (HOSPITAL_COMMUNITY): Payer: Self-pay | Admitting: *Deleted

## 2017-12-13 DIAGNOSIS — R45851 Suicidal ideations: Secondary | ICD-10-CM | POA: Diagnosis present

## 2017-12-13 DIAGNOSIS — O034 Incomplete spontaneous abortion without complication: Secondary | ICD-10-CM | POA: Diagnosis present

## 2017-12-13 DIAGNOSIS — Z915 Personal history of self-harm: Secondary | ICD-10-CM | POA: Diagnosis not present

## 2017-12-13 DIAGNOSIS — F4310 Post-traumatic stress disorder, unspecified: Secondary | ICD-10-CM | POA: Diagnosis present

## 2017-12-13 DIAGNOSIS — Z62819 Personal history of unspecified abuse in childhood: Secondary | ICD-10-CM | POA: Diagnosis not present

## 2017-12-13 DIAGNOSIS — F419 Anxiety disorder, unspecified: Secondary | ICD-10-CM | POA: Diagnosis present

## 2017-12-13 DIAGNOSIS — F329 Major depressive disorder, single episode, unspecified: Secondary | ICD-10-CM | POA: Diagnosis not present

## 2017-12-13 DIAGNOSIS — Z87442 Personal history of urinary calculi: Secondary | ICD-10-CM

## 2017-12-13 DIAGNOSIS — E669 Obesity, unspecified: Secondary | ICD-10-CM | POA: Diagnosis present

## 2017-12-13 DIAGNOSIS — F17200 Nicotine dependence, unspecified, uncomplicated: Secondary | ICD-10-CM | POA: Insufficient documentation

## 2017-12-13 DIAGNOSIS — F332 Major depressive disorder, recurrent severe without psychotic features: Secondary | ICD-10-CM | POA: Diagnosis present

## 2017-12-13 DIAGNOSIS — Z599 Problem related to housing and economic circumstances, unspecified: Secondary | ICD-10-CM | POA: Diagnosis not present

## 2017-12-13 DIAGNOSIS — O039 Complete or unspecified spontaneous abortion without complication: Secondary | ICD-10-CM

## 2017-12-13 DIAGNOSIS — F3132 Bipolar disorder, current episode depressed, moderate: Secondary | ICD-10-CM | POA: Diagnosis not present

## 2017-12-13 DIAGNOSIS — Z560 Unemployment, unspecified: Secondary | ICD-10-CM | POA: Diagnosis not present

## 2017-12-13 DIAGNOSIS — F17210 Nicotine dependence, cigarettes, uncomplicated: Secondary | ICD-10-CM | POA: Diagnosis present

## 2017-12-13 DIAGNOSIS — Z6841 Body Mass Index (BMI) 40.0 and over, adult: Secondary | ICD-10-CM | POA: Diagnosis not present

## 2017-12-13 HISTORY — PX: DILATION AND EVACUATION: SHX1459

## 2017-12-13 LAB — COMPREHENSIVE METABOLIC PANEL
ALT: 36 U/L (ref 0–44)
ANION GAP: 8 (ref 5–15)
AST: 34 U/L (ref 15–41)
Albumin: 3.2 g/dL — ABNORMAL LOW (ref 3.5–5.0)
Alkaline Phosphatase: 59 U/L (ref 38–126)
BUN: 7 mg/dL (ref 6–20)
CHLORIDE: 104 mmol/L (ref 98–111)
CO2: 25 mmol/L (ref 22–32)
Calcium: 8.6 mg/dL — ABNORMAL LOW (ref 8.9–10.3)
Creatinine, Ser: 0.54 mg/dL (ref 0.44–1.00)
GFR calc non Af Amer: >60 mL/min (ref >60)
Glucose, Bld: 99 mg/dL (ref 70–99)
Potassium: 3.5 mmol/L (ref 3.5–5.1)
SODIUM: 137 mmol/L (ref 135–145)
Total Bilirubin: 0.5 mg/dL (ref 0.3–1.2)
Total Protein: 5.9 g/dL — ABNORMAL LOW (ref 6.5–8.1)

## 2017-12-13 LAB — RAPID URINE DRUG SCREEN, HOSP PERFORMED
Amphetamines: NOT DETECTED
Barbiturates: NOT DETECTED
Benzodiazepines: POSITIVE — AB
Cocaine: NOT DETECTED
Opiates: NOT DETECTED
Tetrahydrocannabinol: NOT DETECTED

## 2017-12-13 LAB — CBC
HEMATOCRIT: 34.3 % — AB (ref 36.0–46.0)
HEMOGLOBIN: 11.7 g/dL — AB (ref 12.0–15.0)
MCH: 29.8 pg (ref 26.0–34.0)
MCHC: 34.1 g/dL (ref 30.0–36.0)
MCV: 87.5 fL (ref 78.0–100.0)
Platelets: 303 10*3/uL (ref 150–400)
RBC: 3.92 MIL/uL (ref 3.87–5.11)
RDW: 13.7 % (ref 11.5–15.5)
WBC: 12.3 10*3/uL — ABNORMAL HIGH (ref 4.0–10.5)

## 2017-12-13 LAB — TYPE AND SCREEN
ABO/RH(D): O POS
Antibody Screen: NEGATIVE

## 2017-12-13 LAB — TSH: TSH: 1.831 u[IU]/mL (ref 0.350–4.500)

## 2017-12-13 SURGERY — DILATION AND EVACUATION, UTERUS
Anesthesia: Monitor Anesthesia Care | Site: Vagina

## 2017-12-13 MED ORDER — LACTATED RINGERS IV SOLN: INTRAVENOUS | Status: DC

## 2017-12-13 MED ORDER — CEFAZOLIN SODIUM-DEXTROSE 2-4 GM/100ML-% IV SOLN: 2.0000 g | Freq: Three times a day (TID) | INTRAVENOUS | Status: DC

## 2017-12-13 MED ORDER — TRAZODONE HCL 50 MG PO TABS
50.0000 mg | ORAL_TABLET | Freq: Every evening | ORAL | Status: DC | PRN
  Administered 2017-12-13 – 2017-12-15 (×2): 50 mg via ORAL
  Filled 2017-12-13 (×2): qty 1

## 2017-12-13 MED ORDER — HYDROMORPHONE HCL 1 MG/ML IJ SOLN
1.0000 mg | Freq: Once | INTRAMUSCULAR | Status: DC
Start: 2017-12-13 — End: 2017-12-13
  Filled 2017-12-13: qty 1

## 2017-12-13 MED ORDER — FENTANYL CITRATE (PF) 100 MCG/2ML IJ SOLN
INTRAMUSCULAR | Status: DC | PRN
  Administered 2017-12-13 (×2): 50 ug via INTRAVENOUS

## 2017-12-13 MED ORDER — LACTATED RINGERS IV SOLN
INTRAVENOUS | Status: DC | PRN
  Administered 2017-12-13: 15:00:00 via INTRAVENOUS

## 2017-12-13 MED ORDER — MISOPROSTOL 200 MCG PO TABS: 800.0000 ug | ORAL_TABLET | Freq: Two times a day (BID) | ORAL | 0 refills | Status: AC

## 2017-12-13 MED ORDER — MAGNESIUM HYDROXIDE 400 MG/5ML PO SUSP: 30.0000 mL | Freq: Every day | ORAL | Status: DC | PRN

## 2017-12-13 MED ORDER — ACETAMINOPHEN 325 MG PO TABS
650.0000 mg | ORAL_TABLET | Freq: Four times a day (QID) | ORAL | Status: DC | PRN
  Administered 2017-12-14 – 2017-12-16 (×3): 650 mg via ORAL
  Filled 2017-12-13 (×3): qty 2

## 2017-12-13 MED ORDER — MISOPROSTOL 200 MCG PO TABS
800.0000 ug | ORAL_TABLET | Freq: Two times a day (BID) | ORAL | Status: AC
  Administered 2017-12-14 – 2017-12-15 (×4): 800 ug via ORAL
  Filled 2017-12-13 (×4): qty 4

## 2017-12-13 MED ORDER — PROPOFOL 500 MG/50ML IV EMUL
INTRAVENOUS | Status: DC | PRN
  Administered 2017-12-13: 75 ug/kg/min via INTRAVENOUS

## 2017-12-13 MED ORDER — ONDANSETRON HCL 4 MG/2ML IJ SOLN
INTRAMUSCULAR | Status: DC | PRN
  Administered 2017-12-13: 4 mg via INTRAVENOUS

## 2017-12-13 MED ORDER — KETOROLAC TROMETHAMINE 30 MG/ML IJ SOLN
INTRAMUSCULAR | Status: DC | PRN
  Administered 2017-12-13: 30 mg via INTRAVENOUS

## 2017-12-13 MED ORDER — KETOROLAC TROMETHAMINE 10 MG PO TABS
10.0000 mg | ORAL_TABLET | Freq: Three times a day (TID) | ORAL | Status: DC | PRN
  Administered 2017-12-13 – 2017-12-15 (×3): 10 mg via ORAL
  Filled 2017-12-13 (×3): qty 1

## 2017-12-13 MED ORDER — ALUM & MAG HYDROXIDE-SIMETH 200-200-20 MG/5ML PO SUSP
30.0000 mL | ORAL | Status: DC | PRN
  Administered 2017-12-16: 30 mL via ORAL
  Filled 2017-12-13: qty 30

## 2017-12-13 MED ORDER — FENTANYL CITRATE (PF) 100 MCG/2ML IJ SOLN: 25.0000 ug | INTRAMUSCULAR | Status: DC | PRN

## 2017-12-13 MED ORDER — DEXTROSE 5 % IV SOLN
3.0000 g | Freq: Once | INTRAVENOUS | Status: AC
  Administered 2017-12-13: 3 g via INTRAVENOUS
  Filled 2017-12-13: qty 3

## 2017-12-13 MED ORDER — MISOPROSTOL 200 MCG PO TABS
600.0000 ug | ORAL_TABLET | Freq: Once | ORAL | Status: AC
  Administered 2017-12-13: 600 ug via BUCCAL
  Filled 2017-12-13: qty 3

## 2017-12-13 MED ORDER — MEPERIDINE HCL 25 MG/ML IJ SOLN: 6.2500 mg | INTRAMUSCULAR | Status: DC | PRN

## 2017-12-13 MED ORDER — LIDOCAINE HCL (CARDIAC) PF 100 MG/5ML IV SOSY
PREFILLED_SYRINGE | INTRAVENOUS | Status: DC | PRN
  Administered 2017-12-13: 30 mg via INTRAVENOUS

## 2017-12-13 MED ORDER — HYDROMORPHONE HCL 1 MG/ML IJ SOLN
1.0000 mg | Freq: Once | INTRAMUSCULAR | Status: AC
Start: 2017-12-13 — End: 2017-12-13
  Administered 2017-12-13: 1 mg via INTRAVENOUS
  Filled 2017-12-13: qty 1

## 2017-12-13 MED ORDER — LACTATED RINGERS IV SOLN
INTRAVENOUS | Status: DC
  Administered 2017-12-13: 12:00:00 via INTRAVENOUS

## 2017-12-13 MED ORDER — DEXTROSE 5 % IV SOLN: 2.0000 g | INTRAVENOUS | Status: DC

## 2017-12-13 MED ORDER — MISOPROSTOL 200 MCG PO TABS: 800.0000 ug | ORAL_TABLET | Freq: Three times a day (TID) | ORAL | Status: DC

## 2017-12-13 MED ORDER — SOD CITRATE-CITRIC ACID 500-334 MG/5ML PO SOLN
30.0000 mL | Freq: Once | ORAL | Status: AC
  Administered 2017-12-13: 30 mL via ORAL
  Filled 2017-12-13: qty 15

## 2017-12-13 MED ORDER — MIDAZOLAM HCL 2 MG/2ML IJ SOLN
INTRAMUSCULAR | Status: DC | PRN
  Administered 2017-12-13: 2 mg via INTRAVENOUS

## 2017-12-13 MED ORDER — BUPIVACAINE HCL (PF) 0.5 % IJ SOLN
INTRAMUSCULAR | Status: AC
  Filled 2017-12-13: qty 30

## 2017-12-13 MED ORDER — LIDOCAINE HCL (CARDIAC) PF 100 MG/5ML IV SOSY
PREFILLED_SYRINGE | INTRAVENOUS | Status: AC
  Filled 2017-12-13: qty 5

## 2017-12-13 MED ORDER — MIDAZOLAM HCL 2 MG/2ML IJ SOLN
INTRAMUSCULAR | Status: AC
  Filled 2017-12-13: qty 2

## 2017-12-13 MED ORDER — FAMOTIDINE IN NACL 20-0.9 MG/50ML-% IV SOLN
20.0000 mg | Freq: Once | INTRAVENOUS | Status: AC
  Administered 2017-12-13: 20 mg via INTRAVENOUS
  Filled 2017-12-13: qty 50

## 2017-12-13 MED ORDER — KETOROLAC TROMETHAMINE 10 MG PO TABS: 10.0000 mg | ORAL_TABLET | Freq: Three times a day (TID) | ORAL | 0 refills | Status: AC | PRN

## 2017-12-13 MED ORDER — HYDROXYZINE HCL 25 MG PO TABS
25.0000 mg | ORAL_TABLET | Freq: Three times a day (TID) | ORAL | Status: DC | PRN
  Administered 2017-12-13 – 2017-12-15 (×5): 25 mg via ORAL
  Filled 2017-12-13 (×5): qty 1

## 2017-12-13 MED ORDER — HYDROMORPHONE HCL 1 MG/ML IJ SOLN
1.0000 mg | Freq: Once | INTRAMUSCULAR | Status: AC
  Administered 2017-12-13: 1 mg via INTRAVENOUS

## 2017-12-13 MED ORDER — METOCLOPRAMIDE HCL 5 MG/ML IJ SOLN: 10.0000 mg | Freq: Once | INTRAMUSCULAR | Status: DC | PRN

## 2017-12-13 MED ORDER — PROPOFOL 500 MG/50ML IV EMUL
INTRAVENOUS | Status: DC | PRN
  Administered 2017-12-13 (×3): 20 mg via INTRAVENOUS

## 2017-12-13 MED ORDER — KETOROLAC TROMETHAMINE 60 MG/2ML IM SOLN
60.0000 mg | Freq: Once | INTRAMUSCULAR | Status: AC
  Administered 2017-12-13: 60 mg via INTRAMUSCULAR
  Filled 2017-12-13: qty 2

## 2017-12-13 MED ORDER — PROPOFOL 10 MG/ML IV BOLUS
INTRAVENOUS | Status: AC
  Filled 2017-12-13: qty 40

## 2017-12-13 MED ORDER — FENTANYL CITRATE (PF) 100 MCG/2ML IJ SOLN
INTRAMUSCULAR | Status: AC
  Filled 2017-12-13: qty 2

## 2017-12-13 SURGICAL SUPPLY — 19 items
CATH ROBINSON RED A/P 16FR (CATHETERS) ×3 IMPLANT
DECANTER SPIKE VIAL GLASS SM (MISCELLANEOUS) ×3 IMPLANT
GLOVE BIOGEL PI IND STRL 7.0 (GLOVE) ×1 IMPLANT
GLOVE BIOGEL PI IND STRL 8 (GLOVE) ×1 IMPLANT
GLOVE BIOGEL PI INDICATOR 7.0 (GLOVE) ×2
GLOVE BIOGEL PI INDICATOR 8 (GLOVE) ×2
GLOVE ECLIPSE 8.0 STRL XLNG CF (GLOVE) ×3 IMPLANT
GOWN STRL REUS W/TWL LRG LVL3 (GOWN DISPOSABLE) ×6 IMPLANT
KIT BERKELEY 1ST TRIMESTER 3/8 (MISCELLANEOUS) ×3 IMPLANT
NS IRRIG 1000ML POUR BTL (IV SOLUTION) ×3 IMPLANT
PACK VAGINAL MINOR WOMEN LF (CUSTOM PROCEDURE TRAY) ×3 IMPLANT
PAD OB MATERNITY 4.3X12.25 (PERSONAL CARE ITEMS) ×3 IMPLANT
PAD PREP 24X48 CUFFED NSTRL (MISCELLANEOUS) ×3 IMPLANT
SET BERKELEY SUCTION TUBING (SUCTIONS) ×3 IMPLANT
TOWEL OR 17X24 6PK STRL BLUE (TOWEL DISPOSABLE) ×6 IMPLANT
VACURETTE 10 RIGID CVD (CANNULA) IMPLANT
VACURETTE 7MM CVD STRL WRAP (CANNULA) IMPLANT
VACURETTE 8 RIGID CVD (CANNULA) ×3 IMPLANT
VACURETTE 9 RIGID CVD (CANNULA) IMPLANT

## 2017-12-13 NOTE — Anesthesia Preprocedure Evaluation (Signed)
Anesthesia Evaluation  Patient identified by MRN, date of birth, ID band Patient awake    Reviewed: Allergy & Precautions, NPO status , Patient's Chart, lab work & pertinent test results  Airway Mallampati: II  TM Distance: >3 FB Neck ROM: Full    Dental no notable dental hx.    Pulmonary Current Smoker,    Pulmonary exam normal breath sounds clear to auscultation       Cardiovascular negative cardio ROS Normal cardiovascular exam Rhythm:Regular Rate:Normal     Neuro/Psych negative neurological ROS  negative psych ROS   GI/Hepatic negative GI ROS, Neg liver ROS,   Endo/Other  Morbid obesity  Renal/GU negative Renal ROS  negative genitourinary   Musculoskeletal negative musculoskeletal ROS (+)   Abdominal   Peds negative pediatric ROS (+)  Hematology negative hematology ROS (+)   Anesthesia Other Findings   Reproductive/Obstetrics negative OB ROS                             Anesthesia Physical Anesthesia Plan  ASA: III  Anesthesia Plan: MAC   Post-op Pain Management:    Induction:   PONV Risk Score and Plan: 2 and Ondansetron  Airway Management Planned: Simple Face Mask  Additional Equipment:   Intra-op Plan:   Post-operative Plan:   Informed Consent: I have reviewed the patients History and Physical, chart, labs and discussed the procedure including the risks, benefits and alternatives for the proposed anesthesia with the patient or authorized representative who has indicated his/her understanding and acceptance.   Dental advisory given  Plan Discussed with:   Anesthesia Plan Comments:         Anesthesia Quick Evaluation

## 2017-12-13 NOTE — Transfer of Care (Signed)
Immediate Anesthesia Transfer of Care Note  Patient: Kelly Webb  Procedure(s) Performed: DILATATION AND EVACUATION (N/A Vagina )  Patient Location: PACU  Anesthesia Type:MAC  Level of Consciousness: awake and alert   Airway & Oxygen Therapy: Patient Spontanous Breathing and Patient connected to nasal cannula oxygen  Post-op Assessment: Report given to RN and Post -op Vital signs reviewed and stable  Post vital signs: Reviewed  Last Vitals:  Vitals Value Taken Time  BP    Temp    Pulse    Resp    SpO2      Last Pain:  Vitals:   12/13/17 1106  TempSrc: Oral  PainSc:       Patients Stated Pain Goal: 0 ([id number])  Complications: No apparent anesthesia complications

## 2017-12-13 NOTE — Progress Notes (Signed)
[facility],  No report was called from pervious hospital. Pt stated she did not receive any D/C information, pt stated they just transported her from the previous hospital. " no one told me what to do, what to look for , I was supposed to get an antibiotic pain medication.

## 2017-12-13 NOTE — BH Assessment (Signed)
Tele Assessment Note   Patient Name: Kelly Webb MRN: [id number] Referring Physician: Duane Lope, MD Location of Patient: [facility] Location of Provider: Behavioral Health TTS Department  Hasini Peachey is a 27 y.o. female in [facility] for a D&C. She made some statements that caused concern and a TTS consult was requested. Pt admits to being depressed and having SI. Pt admits to having 5 suicide attempts within the past few years but never being hospitalized b/c "no one pays attention or notices". Pt reports having a traumatic childhood and receiving psychiatry and therapy from ages [age]. Pt stopped all services after this. Pt admits to struggling mentally since stopping her services. Pt additionally reports poor sleep, going sometimes 3 or 4 days without sleeping b/c her mind is racing. Pt denies HI or AVH. Pt is amenable to receiving IP treatment.   Case staffed with Hillery Jacks, NP, and pt is recommended for IP psych treatment for stabilization. Pt is under review at [facility], pending labwork. AC, Linsey, has spoke with Dr. Despina Hidden to inform of what is needed.   Diagnosis: F33.2 MDD, recurrent, severe, w/out psychotic features  Past Medical History:  Past Medical History:  Diagnosis Date  . Anxiety   . Depression   . Kidney stone   . Obesity     Past Surgical History:  Procedure Laterality Date  . ANKLE ARTHROPLASTY Left   . CESAREAN SECTION    . CHOLECYSTECTOMY    . TONSILLECTOMY      Family History: No family history on file.  Social History:  reports that she has been smoking cigarettes. She has been smoking about 0.50 packs per day. She has never used smokeless tobacco. She reports that she does not drink alcohol or use drugs.  Additional Social History:  Alcohol / Drug Use Pain Medications: pt denies Prescriptions: pt denies Over the Counter: pt denies History of alcohol / drug use?: No history of alcohol / drug abuse  CIWA: CIWA-Ar BP: (!) 98/54 Pulse  Rate: 76 COWS:    Allergies: No Known Allergies  Home Medications:  Facility-Administered Medications Prior to Admission  Medication Dose Route Frequency Provider Last Rate Last Dose  . ceFAZolin (ANCEF) 2 g in dextrose 5 % 100 mL IVPB  2 g Intravenous On Call to OR Lazaro Arms, MD       No medications prior to admission.    OB/GYN Status:  Patient's last menstrual period was 10/20/2017.  General Assessment Data Location of Assessment: [facility] MAU(Perioperative area) TTS Assessment: In system Is this a Tele or Face-to-Face Assessment?: Tele Assessment Is this an Initial Assessment or a Re-assessment for this encounter?: Initial Assessment Marital status: Separated Maiden name: Burdell Is patient pregnant?: No Pregnancy Status: No Living Arrangements: Children Can pt return to current living arrangement?: Yes Admission Status: Voluntary Is patient capable of signing voluntary admission?: Yes Referral Source: Self/Family/Friend Insurance type: Medicaid     Crisis Care Plan Living Arrangements: Children Name of Psychiatrist: none Name of Therapist: none  Education Status Is patient currently in school?: No Is the patient employed, unemployed or receiving disability?: Unemployed  Risk to self with the past 6 months Suicidal Ideation: Yes-Currently Present Has patient been a risk to self within the past 6 months prior to admission? : Yes Suicidal Intent: Yes-Currently Present Has patient had any suicidal intent within the past 6 months prior to admission? : Yes Is patient at risk for suicide?: Yes Suicidal Plan?: No-Not Currently/Within Last 6 Months Has patient  had any suicidal plan within the past 6 months prior to admission? : Yes Access to Means: Yes Specify Access to Suicidal Means: pills Previous Attempts/Gestures: Yes How many times?: 5 Triggers for Past Attempts: Unpredictable, Unknown Intentional Self Injurious Behavior: None Family Suicide History:  Unknown Recent stressful life event(s): Other (Comment) Persecutory voices/beliefs?: No Depression: Yes Depression Symptoms: Despondent, Insomnia, Tearfulness, Isolating, Loss of interest in usual pleasures, Feeling worthless/self pity, Feeling angry/irritable Substance abuse history and/or treatment for substance abuse?: No Suicide prevention information given to non-admitted patients: Not applicable  Risk to Others within the past 6 months Homicidal Ideation: No Does patient have any lifetime risk of violence toward others beyond the six months prior to admission? : No Thoughts of Harm to Others: No Current Homicidal Intent: No Current Homicidal Plan: No Access to Homicidal Means: No History of harm to others?: No Assessment of Violence: None Noted Does patient have access to weapons?: No Criminal Charges Pending?: No Does patient have a court date: No Is patient on probation?: Unknown  Psychosis Hallucinations: None noted Delusions: None noted  Mental Status Report Appearance/Hygiene: Unremarkable Eye Contact: Good Motor Activity: Unremarkable Speech: Logical/coherent Level of Consciousness: Quiet/awake Mood: Depressed Affect: Appropriate to circumstance Anxiety Level: Minimal Thought Processes: Coherent, Relevant Judgement: Impaired Orientation: Person, Place, Time, Situation Obsessive Compulsive Thoughts/Behaviors: None  Cognitive Functioning Concentration: Normal Memory: Recent Intact, Remote Intact Is patient IDD: No Is patient DD?: No Insight: Fair Impulse Control: Fair Appetite: Fair Have you had any weight changes? : No Change Sleep: No Change Total Hours of Sleep: 0 Vegetative Symptoms: None  ADLScreening [facility] Assessment Services) Patient's cognitive ability adequate to safely complete daily activities?: Yes Patient able to express need for assistance with ADLs?: Yes Independently performs ADLs?: Yes (appropriate for developmental age)  Prior  Inpatient Therapy Prior Inpatient Therapy: No  Prior Outpatient Therapy Prior Outpatient Therapy: Yes Prior Therapy Dates: 10 years ago Reason for Treatment: depression Does patient have an ACCT team?: No Does patient have Intensive In-House Services?  : No Does patient have [facility] services? : No Does patient have P4CC services?: No  ADL Screening (condition at time of admission) Patient's cognitive ability adequate to safely complete daily activities?: Yes Is the patient deaf or have difficulty hearing?: No Does the patient have difficulty seeing, even when wearing glasses/contacts?: No Does the patient have difficulty concentrating, remembering, or making decisions?: No Patient able to express need for assistance with ADLs?: Yes Does the patient have difficulty dressing or bathing?: No Independently performs ADLs?: Yes (appropriate for developmental age) Does the patient have difficulty walking or climbing stairs?: No Weakness of Legs: None Weakness of Arms/Hands: None  Home Assistive Devices/Equipment Home Assistive Devices/Equipment: None  Therapy Consults (therapy consults require a physician order) PT Evaluation Needed: No OT Evalulation Needed: No SLP Evaluation Needed: No Abuse/Neglect Assessment (Assessment to be complete while patient is alone) Abuse/Neglect Assessment Can Be Completed: Yes Physical Abuse: Denies Verbal Abuse: Denies Sexual Abuse: Denies Exploitation of patient/patient's resources: Denies Self-Neglect: Denies Values / Beliefs Cultural Requests During Hospitalization: None Spiritual Requests During Hospitalization: None Consults Spiritual Care Consult Needed: No Social Work Consult Needed: No Merchant navy officer (For Healthcare) Does Patient Have a Medical Advance Directive?: No Would patient like information on creating a medical advance directive?: No - Patient declined Nutrition Screen- [facility] Adult/WL/AP Patient's home diet: Regular         Disposition:  Disposition Initial Assessment Completed for this Encounter: Yes  This service was provided via telemedicine using  a 2-way, interactive audio and Immunologistvideo technology.  Names of all persons participating in this telemedicine service and their role in this encounter.   Laddie AquasSamantha M Shakya Sebring 12/13/2017 5:36 PM

## 2017-12-13 NOTE — Anesthesia Procedure Notes (Signed)
Procedure Name: MAC Date/Time: 12/13/2017 3:43 PM Performed by: Garner Nash, CRNA Pre-anesthesia Checklist: Patient identified, Emergency Drugs available, Suction available, Patient being monitored and Timeout performed Patient Re-evaluated:Patient Re-evaluated prior to induction Oxygen Delivery Method: Circle system utilized

## 2017-12-13 NOTE — Op Note (Signed)
Preoperative diagnosis:  Incomplete AB 6+ weeks  Postoperative diagnosis:  Same as above  Procedure:  Cervical dilation with suction  uterine curettage  Surgeon:  Florian Buff  Anesthesia:  Laryngeal mask airway  Findings:  Pt known to have a failed pregnancy at 6+ weeks. Presented with bleeding and cramping, has not passed the pregnancy despite repeat dosing cytotec.  Will proceed with D&C  Description of operation:  The patient was taken to the operating room and placed in the supine position.  She underwent MAC anesthesia.  The patient was placed in the dorsal lithotomy position.  The vagina was prepped and draped in the usual sterile fashion.  A Graves speculum was placed.  The anterior cervix was grasped with a single-tooth tenaculum.  The cervix was dilated serially with Hegar dilators.  A #8 curved suction curette was placed in the uterus.  The suction pressure was placed at 55 and several passes were made.  All of the intrauterine contents were removed.  The sharp curette was used x1 to feel uterine crie in all areas.  The patient was given Methergine 0.2 mg IV x1.  There was good hemostasis.  The patient was given Ancef 2 grams preoperatively.  The patient was given Toradol 30 mg IV preoperatively.  Estimated blood loss for the procedure was minimal cc.  The patient was awakened from anesthesia taken to the recovery room in good stable condition.  All counts were correct x3.  Florian Buff 12/13/2017 3:55 PM

## 2017-12-13 NOTE — Anesthesia Postprocedure Evaluation (Signed)
Anesthesia Post Note  Patient: Kelly Webb  Procedure(s) Performed: DILATATION AND EVACUATION (N/A Vagina )     Patient location during evaluation: PACU Anesthesia Type: MAC Level of consciousness: awake and alert Pain management: pain level controlled Vital Signs Assessment: post-procedure vital signs reviewed and stable Respiratory status: spontaneous breathing, nonlabored ventilation, respiratory function stable and patient connected to nasal cannula oxygen Cardiovascular status: stable and blood pressure returned to baseline Postop Assessment: no apparent nausea or vomiting Anesthetic complications: no    Last Vitals:  Vitals:   12/13/17 1615 12/13/17 1630  BP: 99/61 (!) 99/55  Pulse: 72 70  Resp: 14 14  Temp:    SpO2: 96% 99%    Last Pain:  Vitals:   12/13/17 1630  TempSrc:   PainSc: 0-No pain   Pain Goal: Patients Stated Pain Goal: 0 (12/13/17 1050)               Montez Hageman

## 2017-12-13 NOTE — Progress Notes (Signed)
TTS has evaluated the patient and feels she needs in patient therapy She has reported 5 suicide attempts without seeking care She is medically cleared for psychiatric disposition  Lazaro ArmsLuther H Eure, MD 12/13/2017 5:21 PM
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
## 2017-12-13 NOTE — BH Assessment (Signed)
Pt assessed by TTS and recommended inpatient treatment, patient with depression SI and hx of 5 suicide attempts without seeking treatment.  Discussed outstanding labwork needed. [facility] Room 405--2.  Accepting Provider Hillery Jacksanika Lewis  Attending Psychiatrist - Dr. Jama Flavorsobos Number for nurse to nurse report [phone]. Patient is currently voluntary, and must sign voluntary consent prior to transfer. Fax voluntary consent to [phone]. Original consent to accompany patient. Patient may be transported via [facility] [employer] as a voluntary patient. Call [phone] to arrange transportation for patient.
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
## 2017-12-13 NOTE — MAU Note (Signed)
Pt was diagnosed with a miscarriage yesterday. Had some bleeding that soaked through a pad one hour ago. Pt continues to have some bright red bleeding.

## 2017-12-13 NOTE — MAU Note (Signed)
[facility] [phone]). Per Patty please called with any updates prior to transport. House Coverage made aware and will relay to RN.

## 2017-12-14 ENCOUNTER — Encounter (HOSPITAL_COMMUNITY): Payer: Self-pay | Admitting: Obstetrics & Gynecology

## 2017-12-14 DIAGNOSIS — Z560 Unemployment, unspecified: Secondary | ICD-10-CM

## 2017-12-14 DIAGNOSIS — F419 Anxiety disorder, unspecified: Secondary | ICD-10-CM

## 2017-12-14 DIAGNOSIS — Z62819 Personal history of unspecified abuse in childhood: Secondary | ICD-10-CM

## 2017-12-14 DIAGNOSIS — Z599 Problem related to housing and economic circumstances, unspecified: Secondary | ICD-10-CM

## 2017-12-14 DIAGNOSIS — F3132 Bipolar disorder, current episode depressed, moderate: Secondary | ICD-10-CM

## 2017-12-14 LAB — CULTURE, OB URINE

## 2017-12-14 MED ORDER — LURASIDONE HCL 20 MG PO TABS
20.0000 mg | ORAL_TABLET | Freq: Every day | ORAL | Status: DC
  Administered 2017-12-15 – 2017-12-16 (×2): 20 mg via ORAL
  Filled 2017-12-14 (×3): qty 1

## 2017-12-14 MED ORDER — LAMOTRIGINE 25 MG PO TABS
25.0000 mg | ORAL_TABLET | Freq: Every day | ORAL | Status: DC
  Administered 2017-12-15 – 2017-12-16 (×2): 25 mg via ORAL
  Filled 2017-12-14 (×3): qty 1
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
## 2017-12-14 NOTE — [facility] Group Notes (Signed)
[facility] LCSW Group Therapy Note  Date/Time:12/14/17, 1315  Type of Therapy and Topic:  Group Therapy:  Overcoming Obstacles  Participation Level:  moderate  Description of Group:    In this group patients will be encouraged to explore what they see as obstacles to their own wellness and recovery. They will be guided to discuss their thoughts, feelings, and behaviors related to these obstacles. The group will process together ways to cope with barriers, with attention given to specific choices patients can make. Each patient will be challenged to identify changes they are motivated to make in order to overcome their obstacles. This group will be process-oriented, with patients participating in exploration of their own experiences as well as giving and receiving support and challenge from other group members.  Therapeutic Goals: 1. Patient will identify personal and current obstacles as they relate to admission. 2. Patient will identify barriers that currently interfere with their wellness or overcoming obstacles.  3. Patient will identify feelings, thought process and behaviors related to these barriers. 4. Patient will identify two changes they are willing to make to overcome these obstacles:    Summary of Patient Progress: Pt shared that anxiety and depression/bipolar are current obstacles.  Pt was made several comments during group discussion regarding positive ways to overcome obstacles.        Therapeutic Modalities:   Cognitive Behavioral Therapy Solution Focused Therapy Motivational Interviewing Relapse Prevention Therapy  Daleen SquibbGreg Renaldo Gornick, LCSW
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
## 2017-12-14 NOTE — Tx Team (Addendum)
Interdisciplinary Treatment and Diagnostic Plan Update  12/15/2017 Time of Session: [place] MRN: [id number]  Principal Diagnosis: <principal problem not specified>  Secondary Diagnoses: Active Problems:   MDD (major depressive disorder)   Current Medications:  Current Facility-Administered Medications  Medication Dose Route Frequency Provider Last Rate Last Dose  . acetaminophen (TYLENOL) tablet 650 mg  650 mg Oral Q6H PRN Derrill Center, NP   650 mg at 12/14/17 1422  . alum & mag hydroxide-simeth (MAALOX/MYLANTA) 200-200-20 MG/5ML suspension 30 mL  30 mL Oral Q4H PRN Derrill Center, NP      . hydrOXYzine (ATARAX/VISTARIL) tablet 25 mg  25 mg Oral TID PRN Derrill Center, NP   25 mg at 12/15/17 0815  . ketorolac (TORADOL) tablet 10 mg  10 mg Oral Q8H PRN Derrill Center, NP   10 mg at 12/15/17 0814  . lamoTRIgine (LAMICTAL) tablet 25 mg  25 mg Oral Daily Cobos, Myer Peer, MD   25 mg at 12/15/17 0811  . lurasidone (LATUDA) tablet 20 mg  20 mg Oral Q breakfast Cobos, Myer Peer, MD   20 mg at 12/15/17 0811  . magnesium hydroxide (MILK OF MAGNESIA) suspension 30 mL  30 mL Oral Daily PRN Derrill Center, NP      . misoprostol (CYTOTEC) tablet 800 mcg  800 mcg Oral BID Laverle Hobby, PA-C   800 mcg at 12/14/17 1728  . traZODone (DESYREL) tablet 50 mg  50 mg Oral QHS PRN Derrill Center, NP   50 mg at 12/13/17 2219   PTA Medications: Medications Prior to Admission  Medication Sig Dispense Refill Last Dose  . ketorolac (TORADOL) 10 MG tablet Take 1 tablet (10 mg total) by mouth every 8 (eight) hours as needed. 15 tablet 0   . misoprostol (CYTOTEC) 200 MCG tablet Take 4 tablets (800 mcg total) by mouth 2 (two) times daily. 4 tablets at once twice a day for 2 days 16 tablet 0     Patient Stressors: Financial difficulties Health problems Marital or family conflict Medication change or noncompliance  Patient Strengths: Technical sales engineer for  treatment/growth  Treatment Modalities: Medication Management, Group therapy, Case management,  1 to 1 session with clinician, Psychoeducation, Recreational therapy.   Physician Treatment Plan for Primary Diagnosis: <principal problem not specified> Long Term Goal(s): Improvement in symptoms so as ready for discharge Improvement in symptoms so as ready for discharge   Short Term Goals: Ability to identify changes in lifestyle to reduce recurrence of condition will improve Ability to maintain clinical measurements within normal limits will improve Ability to identify changes in lifestyle to reduce recurrence of condition will improve Ability to verbalize feelings will improve Ability to disclose and discuss suicidal ideas Ability to demonstrate self-control will improve Ability to identify and develop effective coping behaviors will improve Ability to maintain clinical measurements within normal limits will improve  Medication Management: Evaluate patient's response, side effects, and tolerance of medication regimen.  Therapeutic Interventions: 1 to 1 sessions, Unit Group sessions and Medication administration.  Evaluation of Outcomes: Not Met  Physician Treatment Plan for Secondary Diagnosis: Active Problems:   MDD (major depressive disorder)  Long Term Goal(s): Improvement in symptoms so as ready for discharge Improvement in symptoms so as ready for discharge   Short Term Goals: Ability to identify changes in lifestyle to reduce recurrence of condition will improve Ability to maintain clinical measurements within normal limits will improve Ability to identify changes in lifestyle  to reduce recurrence of condition will improve Ability to verbalize feelings will improve Ability to disclose and discuss suicidal ideas Ability to demonstrate self-control will improve Ability to identify and develop effective coping behaviors will improve Ability to maintain clinical measurements  within normal limits will improve     Medication Management: Evaluate patient's response, side effects, and tolerance of medication regimen.  Therapeutic Interventions: 1 to 1 sessions, Unit Group sessions and Medication administration.  Evaluation of Outcomes: Not Met   RN Treatment Plan for Primary Diagnosis: <principal problem not specified> Long Term Goal(s): Knowledge of disease and therapeutic regimen to maintain health will improve  Short Term Goals: Ability to identify and develop effective coping behaviors will improve and Compliance with prescribed medications will improve  Medication Management: RN will administer medications as ordered by provider, will assess and evaluate patient's response and provide education to patient for prescribed medication. RN will report any adverse and/or side effects to prescribing provider.  Therapeutic Interventions: 1 on 1 counseling sessions, Psychoeducation, Medication administration, Evaluate responses to treatment, Monitor vital signs and CBGs as ordered, Perform/monitor CIWA, COWS, AIMS and Fall Risk screenings as ordered, Perform wound care treatments as ordered.  Evaluation of Outcomes: Not Met   LCSW Treatment Plan for Primary Diagnosis: <principal problem not specified> Long Term Goal(s): Safe transition to appropriate next level of care at discharge, Engage patient in therapeutic group addressing interpersonal concerns.  Short Term Goals: Engage patient in aftercare planning with referrals and resources, Increase social support and Increase skills for wellness and recovery  Therapeutic Interventions: Assess for all discharge needs, 1 to 1 time with Social worker, Explore available resources and support systems, Assess for adequacy in community support network, Educate family and significant other(s) on suicide prevention, Complete Psychosocial Assessment, Interpersonal group therapy.  Evaluation of Outcomes: Not Met   Progress in  Treatment: Attending groups: Yes. Participating in groups: Yes. Taking medication as prescribed: Yes. Toleration medication: Yes. Family/Significant other contact made: No, will contact:  mother Patient understands diagnosis: Yes. Discussing patient identified problems/goals with staff: Yes. Medical problems stabilized or resolved: Yes. Denies suicidal/homicidal ideation: Yes. Issues/concerns per patient self-inventory: No. Other: none  New problem(s) identified: No, Describe:  none  New Short Term/Long Term Goal(s):  Patient Goals:  "I don't have a goal."  Discharge Plan or Barriers:   Reason for Continuation of Hospitalization: Depression Medication stabilization  Estimated Length of Stay: 2-4 days.  Attendees: Patient: Kelly Webb 12/14/2017   Physician: Dr. Parke Poisson, MD 12/14/2017   Nursing: Eben Burow, RN 12/14/2017   RN Care Manager: 12/14/2017   Social Worker: Lurline Idol, LCSW 12/14/2017   Recreational Therapist:  12/14/2017   Other:  12/14/2017   Other:  12/14/2017   Other: 12/14/2017        Scribe for Treatment Team: Joanne Chars, LCSW 12/15/2017 8:15 AM

## 2017-12-14 NOTE — Plan of Care (Signed)
Nurse discussed anxiety, depression, coping skills with patient. 
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
## 2017-12-14 NOTE — Progress Notes (Signed)
D:  Patient denied SI and HI, contracts for safety.  Denied A/V hallucinations. A:  Medications administered per MD orders.  Emotional support and encouragement given patient. R:  Safety maintained with 15 minute checks. Patient used wheelchair this morning.  Has been walking slowly this afternoon.  Patient has been on the phone trying to locate her young daughter who is scheduled to start kindergarten tomorrow.  Patient is tearful and concerned about her young daughter.

## 2017-12-14 NOTE — Progress Notes (Signed)
Recreation Therapy Notes  Date: 8.26.19 Time: 0930 Location: [address] Dayroom  Group Topic: Stress Management  Goal Area(s) Addresses:  Patient will verbalize importance of using healthy stress management.  Patient will identify positive emotions associated with healthy stress management.   Intervention: Stress Management  Activity :  Guided Imagery.  LRT introduced the stress management technique of guided imagery.  LRT played a meditation on the computer for patients to engage in the activity.  Patients were to follow along as the meditation played.  Education:  Stress Management, Discharge Planning.   Education Outcome: Acknowledges edcuation/In group clarification offered/Needs additional education  Clinical Observations/Feedback: Pt did not attend group.     Prudencio Velazco, LRT/CTRS         Kattleya Kuhnert A 12/14/2017 12:21 PM 

## 2017-12-14 NOTE — BHH Counselor (Signed)
CSW met with pt to complete PSA. Patient does not have a preference regarding aftercare providers post discharge. Patient reported she has DSS involvement with her 28 year old child. She continually mentions the welfare of this child as a stressor stating "she starts school tomorrow and I need to be home for that."    S. , LCSWA, MSW [facility]: Child and Adolescent  [phone]   

## 2017-12-14 NOTE — Tx Team (Signed)
Initial Treatment Plan 12/14/2017 12:28 AM Kelly Webb ZOX:[id number]    PATIENT STRESSORS: Financial difficulties Health problems Marital or family conflict Medication change or noncompliance   PATIENT STRENGTHS: General fund of knowledge Motivation for treatment/growth   PATIENT IDENTIFIED PROBLEMS: Risk for suicide  Depression, anxiety  Financial issues   "help with depression and problems"               DISCHARGE CRITERIA:  Improved stabilization in mood, thinking, and/or behavior Verbal commitment to aftercare and medication compliance  PRELIMINARY DISCHARGE PLAN: Attend aftercare/continuing care group Attend PHP/IOP Outpatient therapy  PATIENT/FAMILY INVOLVEMENT: This treatment plan has been presented to and reviewed with the patient, Kelly Webb.  The patient and family have been given the opportunity to ask questions and make suggestions.  Delos HaringPhillips, Lenetta Piche A, RN 12/14/2017, 12:28 AM

## 2017-12-15 DIAGNOSIS — F17210 Nicotine dependence, cigarettes, uncomplicated: Secondary | ICD-10-CM

## 2017-12-15 DIAGNOSIS — F329 Major depressive disorder, single episode, unspecified: Secondary | ICD-10-CM

## 2017-12-15 LAB — LIPID PANEL
CHOL/HDL RATIO: 5.7 ratio
Cholesterol: 154 mg/dL (ref 0–200)
HDL: 27 mg/dL — AB (ref >40)
LDL CALC: 78 mg/dL (ref 0–99)
Triglycerides: 244 mg/dL — ABNORMAL HIGH (ref <150)
VLDL: 49 mg/dL — ABNORMAL HIGH (ref 0–40)

## 2017-12-15 LAB — HEMOGLOBIN A1C
HEMOGLOBIN A1C: 4.9 % (ref 4.8–5.6)
Mean Plasma Glucose: 93.93 mg/dL

## 2017-12-15 MED ORDER — TRAMADOL HCL 50 MG PO TABS
50.0000 mg | ORAL_TABLET | Freq: Four times a day (QID) | ORAL | Status: DC | PRN
  Administered 2017-12-15 (×2): 50 mg via ORAL
  Filled 2017-12-15 (×2): qty 1

## 2017-12-15 NOTE — BHH Group Notes (Signed)
B[facility] Group Notes:  (Nursing/MHT/Case Management/Adjunct)  Date:   12/14/2017 Time:  4:00 pm  Type of Therapy:  Psychoeducational Skills  Participation Level:  Did Not Attend  Participation Quality:    Affect:    Cognitive:    Insight:    Engagement in Group:    Modes of Intervention:    Summary of Progress/Problems:  Earline MayotteKnight, Rusty Glodowski Shephard 12/15/2017, 8:54 AM

## 2017-12-15 NOTE — Progress Notes (Signed)
Recreation Therapy Notes  Animal-Assisted Activity (AAA) Program Checklist/Progress Notes Patient Eligibility Criteria Checklist & Daily Group note for Rec Tx Intervention  Date: 8.27.19 Time: 1430 [address]   AAA/T Program Assumption of Risk Form signed by Engineer, productionatient/ or Parent Legal Guardian YES   Patient is free of allergies or sever asthma  YES   Patient reports no fear of animals  YES  Patient reports no history of cruelty to animals YES   Patient understands his/her participation is voluntary YES   Patient washes hands before animal contact YES   Patient washes hands after animal contact  YES   Behavioral Response: Engaged  Education: Charity fundraiserHand Washing, Appropriate Animal Interaction   Education Outcome: Acknowledges understanding/In group clarification offered/Needs additional education.   Clinical Observations/Feedback: Pt attended and participated in group.    Kelly Webb, LRT/CTRS         Kelly RancherLindsay, [name] 12/15/2017 3:39 PM

## 2017-12-15 NOTE — Plan of Care (Signed)
Nurse discussed anxiety, depression, coping skills with patient. 

## 2017-12-15 NOTE — BHH Suicide Risk Assessment (Signed)
BHH INPATIENT:  Family/Significant Other Suicide Prevention Education  Suicide Prevention Education:  Education Completed;Kelly Webb, mother, [phone]   has been identified by the patient as the family member/significant other with whom the patient will be residing, and identified as the person(s) who will aid the patient in the event of a mental health crisis (suicidal ideations/suicide attempt).  With written consent from the patient, the family member/significant other has been provided the following suicide prevention education, prior to the and/or following the discharge of the patient.  The suicide prevention education provided includes the following:  Suicide risk factors  Suicide prevention and interventions  National Suicide Hotline telephone number  [facility] assessment telephone number  [facility] Emergency Assistance 911  [facility] and/or Residential Mobile Crisis Unit telephone number  Request made of family/significant other to:  Remove weapons (e.g., guns, rifles, knives), all items previously/currently identified as safety concern.  No guns in the home that Wyatt Mageabitha is aware of.  Remove drugs/medications (over-the-counter, prescriptions, illicit drugs), all items previously/currently identified as a safety concern.  The family member/significant other verbalizes understanding of the suicide prevention education information provided.  The family member/significant other agrees to remove the items of safety concern listed above.   Lorri FrederickWierda, Dayten Juba Jon, LCSW 12/15/2017, 2:32 PM

## 2017-12-15 NOTE — BHH Group Notes (Signed)
B[facility] Group Notes:  (Nursing/MHT/Case Management/Adjunct)  Date:  12/15/2017  Time:  3:15 pm  Type of Therapy:  Psychoeducational Skills  Participation Level:  Did Not Attend  Participation Quality:    Affect:    Cognitive:    Insight:   Engagement in Group:    Modes of Intervention:    Summary of Progress/Problems:  Earline MayotteKnight, Neilani Duffee Shephard 12/15/2017, 6:00 PM

## 2017-12-15 NOTE — Progress Notes (Addendum)
D:  Patient has been sleeping most of the day.  Patient denied SI and HI, contracts for safety.  Denied A/V hallucinations.   A:  Medications administered per MD orders.  Emotional support and encouragement given patient. R:  Safety maintained with 15 minute checks.   Patient has not been using wheelchair today. Patient has been tearful today, worrying about her young daughter starting kindergarten today.

## 2017-12-15 NOTE — Progress Notes (Signed)
[facility] MD Progress Note  12/15/2017 3:03 PM Kelly Webb  MRN:  [id number] Subjective: Patient is seen and examined.  Patient is a 27 year old female with a past psychiatric history significant for bipolar disorder who was admitted after concern about suicidal ideation.  The patient admitted to being depressed and having suicidal ideation on admission.  Unfortunately she had recently undergone a dilatation and evacuation.  The patient had known to have a failed pregnancy at 6+ weeks.  She presented with bleeding and cramping.  She had not passed the pregnancy despite repeat dosing with Cytotec.  It was recommended that she go under a D&C.  She is depressed and tearful, she is grieving the loss of her child.  She did admit to a history of bipolar disorder, and is currently on Latuda 20 mg p.o. daily as well as Lamictal 25 mg p.o. daily.  She denied any suicidal ideation today, she is tearful, but denied suicidal ideation. Principal Problem: <principal problem not specified> Diagnosis:   Patient Active Problem List   Diagnosis Date Noted  . MDD (major depressive disorder) [F32.9] 12/13/2017   Total Time spent with patient: 15 minutes  Past Psychiatric History: See admission H&P  Past Medical History:  Past Medical History:  Diagnosis Date  . Anxiety   . Depression   . Kidney stone   . Obesity     Past Surgical History:  Procedure Laterality Date  . ANKLE ARTHROPLASTY Left   . CESAREAN SECTION    . CHOLECYSTECTOMY    . DILATION AND EVACUATION N/A 12/13/2017   Procedure: DILATATION AND EVACUATION;  Surgeon: Lazaro Arms, MD;  Location: [facility];  Service: Gynecology;  Laterality: N/A;  . TONSILLECTOMY     Family History: History reviewed. No pertinent family history. Family Psychiatric  History: See admission H&P Social History:  Social History   Substance and Sexual Activity  Alcohol Use Yes     Social History   Substance and Sexual Activity  Drug Use No    Social History    Socioeconomic History  . Marital status: Single    Spouse name: Not on file  . Number of children: Not on file  . Years of education: Not on file  . Highest education level: Not on file  Occupational History  . Not on file  Social Needs  . Financial resource strain: Very hard  . Food insecurity:    Worry: Often true    Inability: Often true  . Transportation needs:    Medical: No    Non-medical: No  Tobacco Use  . Smoking status: Current Every Day Smoker    Packs/day: 0.50    Years: 15.00    Pack years: 7.50    Types: Cigarettes  . Smokeless tobacco: Never Used  Substance and Sexual Activity  . Alcohol use: Yes  . Drug use: No  . Sexual activity: Yes    Birth control/protection: None  Lifestyle  . Physical activity:    Days per week: 0 days    Minutes per session: 0 min  . Stress: Very much  Relationships  . Social connections:    Talks on phone: Not on file    Gets together: Not on file    Attends religious service: Not on file    Active member of club or organization: Not on file    Attends meetings of clubs or organizations: Not on file    Relationship status: Not on file  Other Topics Concern  . Not on  file  Social History Narrative  . Not on file   Additional Social History:                         Sleep: Fair  Appetite:  Fair  Current Medications: Current Facility-Administered Medications  Medication Dose Route Frequency Provider Last Rate Last Dose  . acetaminophen (TYLENOL) tablet 650 mg  650 mg Oral Q6H PRN Oneta RackLewis, Tanika N, NP   650 mg at 12/14/17 1422  . alum & mag hydroxide-simeth (MAALOX/MYLANTA) 200-200-20 MG/5ML suspension 30 mL  30 mL Oral Q4H PRN Oneta RackLewis, Tanika N, NP      . hydrOXYzine (ATARAX/VISTARIL) tablet 25 mg  25 mg Oral TID PRN Oneta RackLewis, Tanika N, NP   25 mg at 12/15/17 0815  . lamoTRIgine (LAMICTAL) tablet 25 mg  25 mg Oral Daily Cobos, Rockey SituFernando A, MD   25 mg at 12/15/17 0811  . lurasidone (LATUDA) tablet 20 mg  20 mg Oral  Q breakfast Cobos, Rockey SituFernando A, MD   20 mg at 12/15/17 0811  . magnesium hydroxide (MILK OF MAGNESIA) suspension 30 mL  30 mL Oral Daily PRN Oneta RackLewis, Tanika N, NP      . misoprostol (CYTOTEC) tablet 800 mcg  800 mcg Oral BID Kerry HoughSimon, Spencer E, PA-C   800 mcg at 12/15/17 1037  . traMADol (ULTRAM) tablet 50 mg  50 mg Oral Q6H PRN Antonieta Pertlary, Tawan Degroote Lawson, MD   50 mg at 12/15/17 1420  . traZODone (DESYREL) tablet 50 mg  50 mg Oral QHS PRN Oneta RackLewis, Tanika N, NP   50 mg at 12/13/17 2219    Lab Results:  Results for orders placed or performed during the hospital encounter of 12/13/17 (from the past 48 hour(s))  Lipid panel     Status: Abnormal   Collection Time: 12/15/17  6:27 AM  Result Value Ref Range   Cholesterol 154 0 - 200 mg/dL   Triglycerides 161244 (H) <150 mg/dL   HDL 27 (L) >09>40 mg/dL   Total CHOL/HDL Ratio 5.7 RATIO   VLDL 49 (H) 0 - 40 mg/dL   LDL Cholesterol 78 0 - 99 mg/dL    Comment:        Total Cholesterol/HDL:CHD Risk Coronary Heart Disease Risk Table                     Men   Women  1/2 Average Risk   3.4   3.3  Average Risk       5.0   4.4  2 X Average Risk   9.6   7.1  3 X Average Risk  23.4   11.0        Use the calculated Patient Ratio above and the CHD Risk Table to determine the patient's CHD Risk.        ATP III CLASSIFICATION (LDL):  <100     mg/dL   Optimal  [id number]  mg/dL   Near or Above                    Optimal  130-159  mg/dL   Borderline  [id number]  mg/dL   High  >478>190     mg/dL   Very High Performed at [facility], [address] [place], [place] [address]   Hemoglobin A1c     Status: None   Collection Time: 12/15/17  6:27 AM  Result Value Ref Range   Hgb A1c MFr Bld 4.9 4.8 -  5.6 %    Comment: (NOTE) Pre diabetes:          5.7%-6.4% Diabetes:              >6.4% Glycemic control for   <7.0% adults with diabetes    Mean Plasma Glucose 93.93 mg/dL    Comment: Performed at [facility], [address] [place], [place] [address]     Blood Alcohol level:  No results found for: [facility]  Metabolic Disorder Labs: Lab Results  Component Value Date   HGBA1C 4.9 12/15/2017   MPG 93.93 12/15/2017   No results found for: PROLACTIN Lab Results  Component Value Date   CHOL 154 12/15/2017   TRIG 244 (H) 12/15/2017   HDL 27 (L) 12/15/2017   CHOLHDL 5.7 12/15/2017   VLDL 49 (H) 12/15/2017   LDLCALC 78 12/15/2017    Physical Findings: AIMS: Facial and Oral Movements Muscles of Facial Expression: None, normal Lips and Perioral Area: None, normal Jaw: None, normal Tongue: None, normal,Extremity Movements Upper (arms, wrists, hands, fingers): None, normal Lower (legs, knees, ankles, toes): None, normal, Trunk Movements Neck, shoulders, hips: None, normal, Overall Severity Severity of abnormal movements (highest score from questions above): None, normal Incapacitation due to abnormal movements: None, normal Patient's awareness of abnormal movements (rate only patient's report): No Awareness, Dental Status Current problems with teeth and/or dentures?: No Does patient usually wear dentures?: No  CIWA:  CIWA-Ar Total: 1 COWS:  COWS Total Score: 1  Musculoskeletal: Strength & Muscle Tone: within normal limits Gait & Station: normal Patient leans: N/A  Psychiatric Specialty Exam: Physical Exam  Nursing note and vitals reviewed. Constitutional: She is oriented to person, place, and time. She appears well-developed and well-nourished.  HENT:  Head: Normocephalic and atraumatic.  Respiratory: Effort normal.  Neurological: She is alert and oriented to person, place, and time.    ROS  Blood pressure 122/78, pulse 93, temperature 98.6 F (37 C), temperature source Oral, resp. rate 18, height 5\' 5"  (1.651 m), weight 128.4 kg, last menstrual period 10/20/2017, SpO2 (!) 89 %.Body mass index is 47.09 kg/m.  General Appearance: Casual  Eye Contact:  Fair  Speech:  Normal Rate  Volume:  Decreased  Mood:  Depressed   Affect:  Appropriate  Thought Process:  Coherent and Descriptions of Associations: Intact  Orientation:  Full (Time, Place, and Person)  Thought Content:  Logical  Suicidal Thoughts:  No  Homicidal Thoughts:  No  Memory:  Immediate;   Fair Recent;   Fair Remote;   Fair  Judgement:  Intact  Insight:  Fair  Psychomotor Activity:  Increased  Concentration:  Concentration: Fair and Attention Span: Fair  Recall:  [employer] of Knowledge:  Fair  Language:  Fair  Akathisia:  Negative  Handed:  Right  AIMS (if indicated):     Assets:  Communication Skills Desire for Improvement Housing Physical Health Resilience  ADL's:  Intact  Cognition:  WNL  Sleep:  Number of Hours: 5.75     Treatment Plan Summary: Daily contact with patient to assess and evaluate symptoms and progress in treatment, Medication management and Plan : Patient is seen and examined.  Patient is a 27 year old female with the above-stated past psychiatric history seen in follow-up.  She seems to be coping a little bit better.  She continues on Lamictal and  Jordan.  No changes in this for now.  If things continue to do better we will plan on discharge tomorrow.  She  will need grief therapy after discharge.  Antonieta Pert, MD 12/15/2017, 3:03 PM

## 2017-12-15 NOTE — [facility] Suicide Risk Assessment (Signed)
[facility] INPATIENT:  Family/Significant Other Suicide Prevention Education  Suicide Prevention Education:  Contact Attempts: Guinevere Scarletabitha Jennings, mother, [phone] has been identified by the patient as the family member/significant other with whom the patient will be residing, and identified as the person(s) who will aid the patient in the event of a mental health crisis.  With written consent from the patient, two attempts were made to provide suicide prevention education, prior to and/or following the patient's discharge.  We were unsuccessful in providing suicide prevention education.  A suicide education pamphlet was given to the patient to share with family/significant other.  Date and time of first attempt:12/15/17, [id number] Date and time of second attempt:  Lorri FrederickWierda, Janine Reller Jon, LCSW 12/15/2017, 2:22 PM

## 2017-12-16 ENCOUNTER — Encounter (HOSPITAL_COMMUNITY): Payer: Self-pay | Admitting: Behavioral Health

## 2017-12-16 DIAGNOSIS — F332 Major depressive disorder, recurrent severe without psychotic features: Secondary | ICD-10-CM

## 2017-12-16 MED ORDER — LURASIDONE HCL 20 MG PO TABS: 20.0000 mg | ORAL_TABLET | Freq: Every day | ORAL | 0 refills | Status: DC

## 2017-12-16 MED ORDER — TRAZODONE HCL 50 MG PO TABS: 50.0000 mg | ORAL_TABLET | Freq: Every evening | ORAL | 0 refills | Status: AC | PRN

## 2017-12-16 MED ORDER — LURASIDONE HCL 20 MG PO TABS: 20.0000 mg | ORAL_TABLET | Freq: Every day | ORAL | 0 refills | Status: AC

## 2017-12-16 MED ORDER — HYDROXYZINE HCL 25 MG PO TABS: 25.0000 mg | ORAL_TABLET | Freq: Three times a day (TID) | ORAL | 0 refills | Status: AC | PRN

## 2017-12-16 MED ORDER — LAMOTRIGINE 25 MG PO TABS: 25.0000 mg | ORAL_TABLET | Freq: Every day | ORAL | 0 refills | Status: AC

## 2017-12-16 NOTE — [facility] Suicide Risk Assessment (Signed)
[facility]   Principal Problem: <principal problem not specified> Discharge Diagnoses:  Patient Active Problem List   Diagnosis Date Noted  . MDD (major depressive disorder) [F32.9] 12/13/2017    Total Time spent with patient: 15 minutes  Musculoskeletal: Strength & Muscle Tone: within normal limits Gait & Station: normal Patient leans: N/A  Psychiatric Specialty Exam: Review of Systems  All other systems reviewed and are negative.   Blood pressure 116/66, pulse 92, temperature 98.1 F (36.7 C), temperature source Oral, resp. rate 18, height 5\' 5"  (1.651 m), weight 128.4 kg, last menstrual period 10/20/2017, SpO2 (!) 89 %.Body mass index is 47.09 kg/m.  General Appearance: Casual  Eye Contact::  Good  Speech:  Normal Rate409  Volume:  Normal  Mood:  Euthymic  Affect:  Congruent  Thought Process:  Coherent and Descriptions of Associations: Intact  Orientation:  Full (Time, Place, and Person)  Thought Content:  Logical  Suicidal Thoughts:  No  Homicidal Thoughts:  No  Memory:  Immediate;   Fair Recent;   Fair Remote;   Fair  Judgement:  Intact  Insight:  Fair  Psychomotor Activity:  Normal  Concentration:  Good  Recall:  [employer]  Fund of Knowledge:Fair  Language: Fair  Akathisia:  Negative  Handed:  Right  AIMS (if indicated):     Assets:  Communication Skills Desire for Improvement Housing Physical Health Resilience Social Support  Sleep:  Number of Hours: 6.75  Cognition: WNL  ADL's:  Intact   Mental Status Per Nursing Assessment::   On Admission:  Suicide plan  Demographic Factors:  Adolescent or young adult, Caucasian and Low socioeconomic status  Loss Factors: NA  Historical Factors: Impulsivity  Risk Reduction Factors:   Sense of responsibility to family  Continued Clinical Symptoms:  Depression:   Comorbid alcohol abuse/dependence Impulsivity Alcohol/Substance Abuse/Dependencies  Cognitive Features That  Contribute To Risk:  None    Suicide Risk:  Minimal: No identifiable suicidal ideation.  Patients presenting with no risk factors but with morbid ruminations; may be classified as minimal risk based on the severity of the depressive symptoms  Follow-up Information    [facility]. Go in 3 day(s).   Why:  Please attend a walk in appt Monday-Friday at 8:00am.  Please do not walk in on Thursday 8/29 or Monday, 9/2 (Labor Day)  Coming early minimizes the wait time. Contact information: [address] [place] [address] [phone]           Plan Of Care/Follow-up recommendations:  Activity:  ad lib  Antonieta PertGreg Lawson Tyiesha Brackney, MD 12/16/2017, 7:47 AM
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
## 2017-12-16 NOTE — [facility] Group Notes (Signed)
[facility] LCSW Group Therapy Note  Date/Time: 12/15/17, 1315  Type of Therapy/Topic:  Group Therapy:  Feelings about Diagnosis  Participation Level:  Active   Mood:pleasant   Description of Group:    This group will allow patients to explore their thoughts and feelings about diagnoses they have received. Patients will be guided to explore their level of understanding and acceptance of these diagnoses. Facilitator will encourage patients to process their thoughts and feelings about the reactions of others to their diagnosis, and will guide patients in identifying ways to discuss their diagnosis with significant others in their lives. This group will be process-oriented, with patients participating in exploration of their own experiences as well as giving and receiving support and challenge from other group members.   Therapeutic Goals: 1. Patient will demonstrate understanding of diagnosis as evidence by identifying two or more symptoms of the disorder:  2. Patient will be able to express two feelings regarding the diagnosis 3. Patient will demonstrate ability to communicate their needs through discussion and/or role plays  Summary of Patient Progress:Pt active in group discussion regarding diagnosis and acceptance of a mental health diagnosis.         Therapeutic Modalities:   Cognitive Behavioral Therapy Brief Therapy Feelings Identification   Greg Lupe Handley, LCSW 

## 2017-12-16 NOTE — Progress Notes (Signed)
  [facility] :  Will you be returning to the same living situation after discharge:  Yes,  own home At discharge, do you have transportation home?: Yes,  own car Do you have the ability to pay for your medications: Yes,  medicaid  Release of information consent forms completed and in the chart;  Patient's signature needed at discharge.  Patient to Follow up at: Follow-up Information    [facility], [facility]. Go in 3 day(s).   Why:  Please attend a walk in appt Monday-Friday at 8:00am.  Please do not walk in on Thursday 8/29 or Monday, 9/2 (Labor Day)  Coming early minimizes the wait time. Contact information: [address] [place] [address] [phone]           Next level of care provider has access to [facility] Link:no  Safety Planning and Suicide Prevention discussed: Yes,  with mother  Have you used any form of tobacco in the last 30 days? (Cigarettes, Smokeless Tobacco, Cigars, and/or Pipes): Yes  Has patient been referred to the Quitline?: Patient refused referral  Patient has been referred for addiction treatment: Pt. refused referral  Lorri FrederickWierda, Jeremey Bascom Jon, LCSW 12/16/2017, 11:25 AM

## 2017-12-16 NOTE — Progress Notes (Signed)
D   Pt visible on the milieu this evening   She is pleasant on approach and cooperative   She endorses depression and anxiety    She is ambulating well on her own and said her pain is being  relieved with tramadol  A    Verbal support given   Medications administered and effectiveness monitored    Q 15 min checks  R    Pt is presently safe and receptive to verbal support

## 2017-12-16 NOTE — Progress Notes (Signed)
Pt discharged by Zada FindersJane, O., RN. Pt received both written and verbal discharge instructions. Pt verbalized understanding of discharge instructions. Pt agreed to f/u appt and med regimen. Pt received d/c packet and prescriptions. Pt gathered her belongings from room and locker. Pt safety discharged to the lobby.

## 2018-10-19 ENCOUNTER — Encounter (HOSPITAL_COMMUNITY): Payer: Self-pay

## 2019-02-20 IMAGING — US US OB TRANSVAGINAL
1 series · 15 of 28 positions shown · non-contrast
Comparison: Ultrasound December 05, 2017 performed at [HOSPITAL]
[HOSPITAL].

CLINICAL DATA: Vaginal bleeding in first trimester of pregnancy.

EXAM:
TRANSVAGINAL OB ULTRASOUND
TECHNIQUE: Transvaginal ultrasound was performed for complete evaluation of the
gestation as well as the maternal uterus, adnexal regions, and
pelvic cul-de-sac.

[Series 1: us ob transvaginal · 15 of 42 slices shown]
[im 1/42]
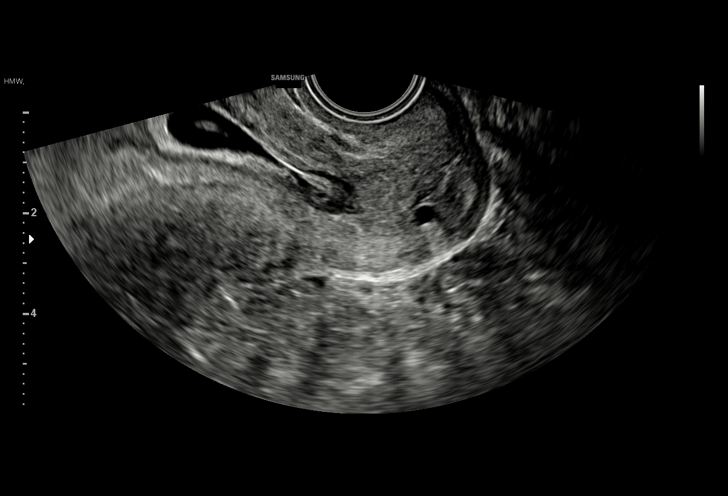
[im 4/42]
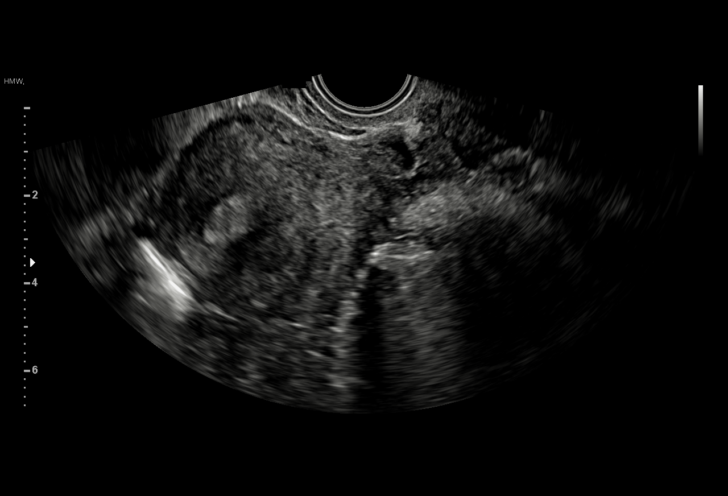
[im 7/42]
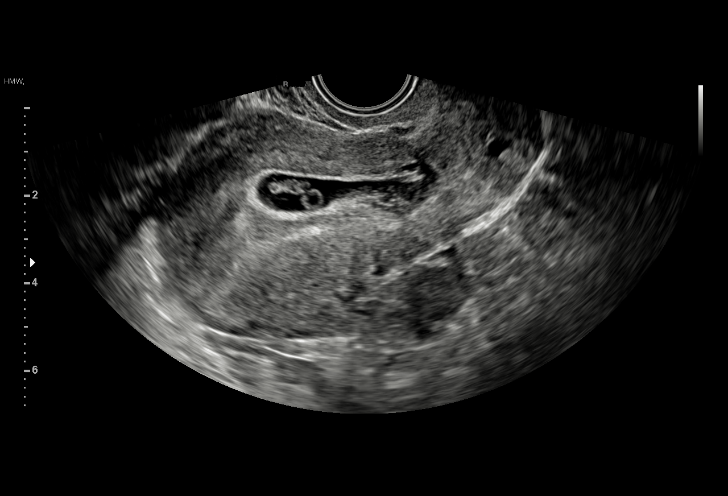
[im 10/42]
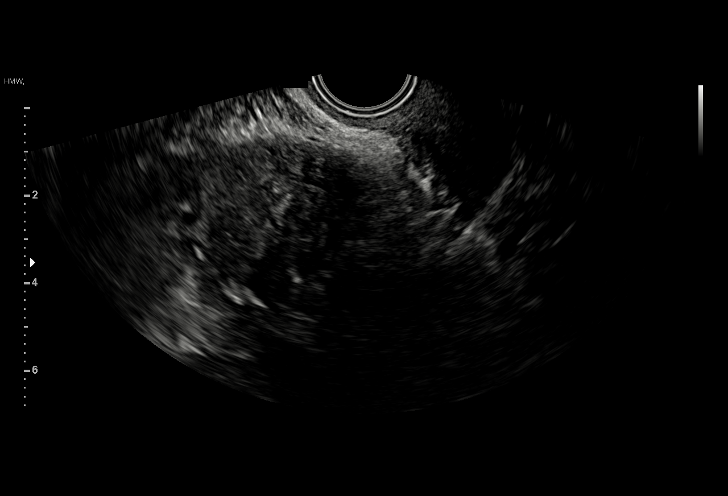
[im 13/42]
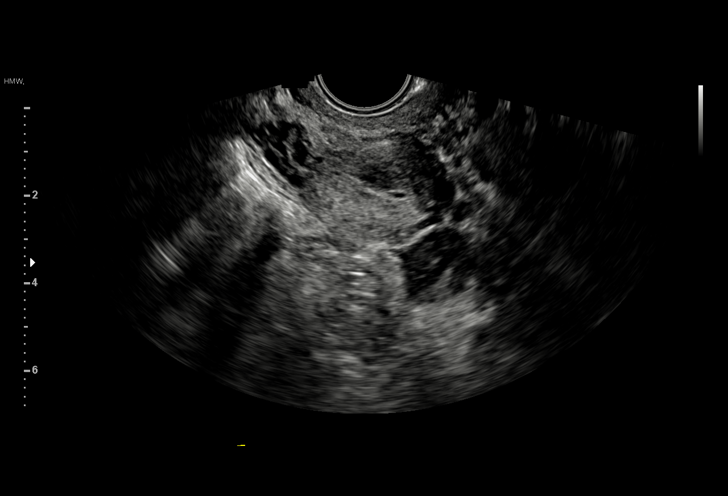
[im 16/42]
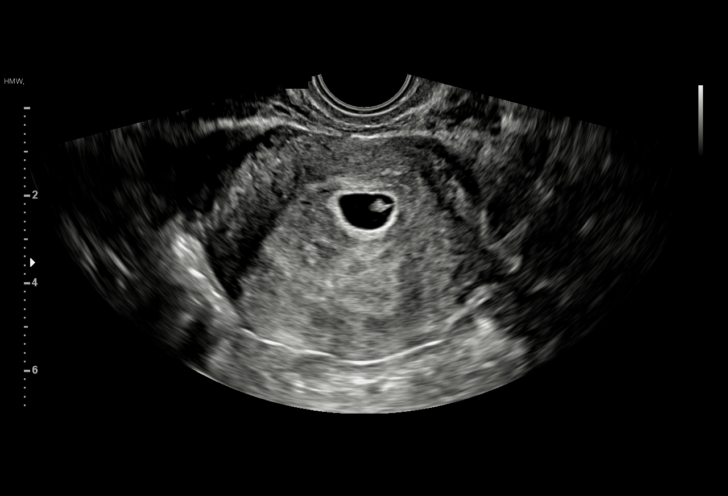
[im 19/42]
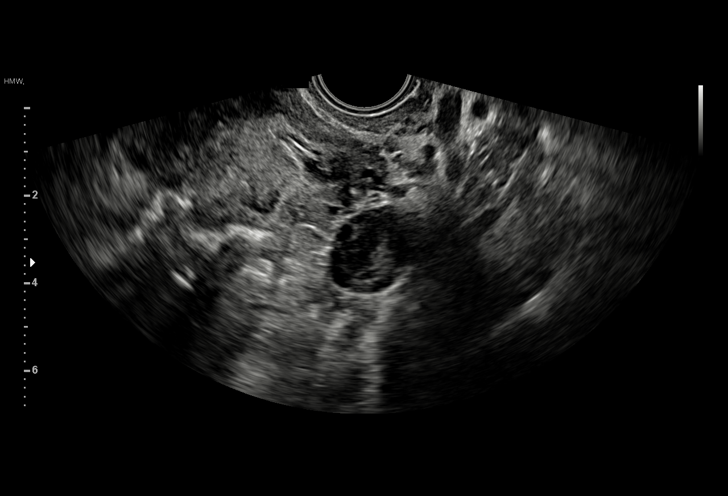
[im 22/42]
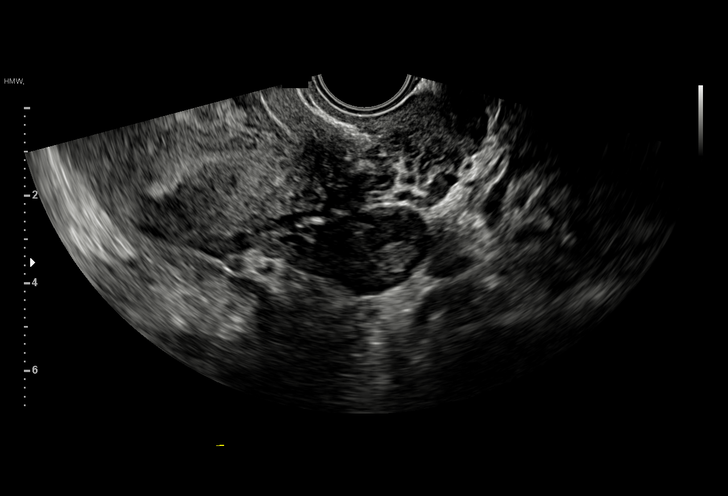
[im 23/42]
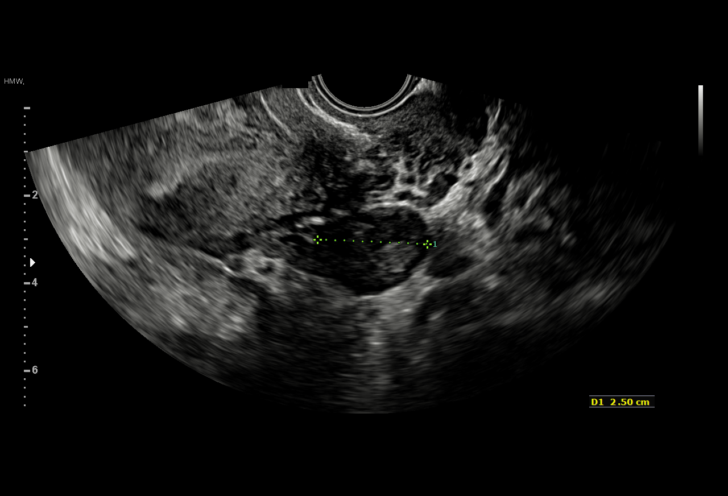
[im 26/42]
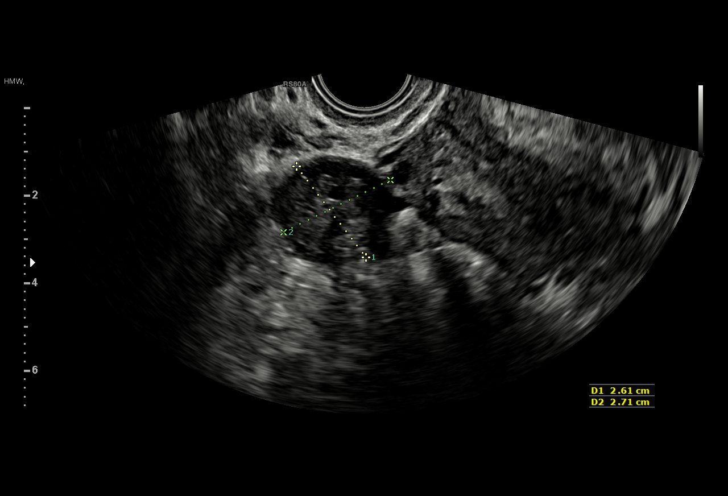
[im 29/42]
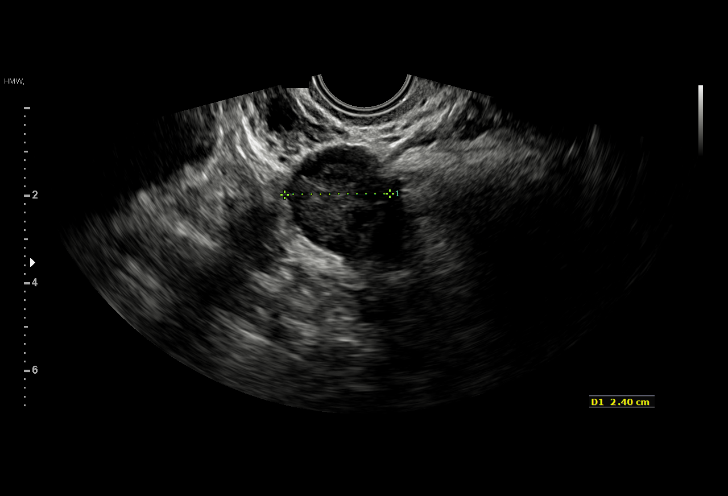
[im 32/42]
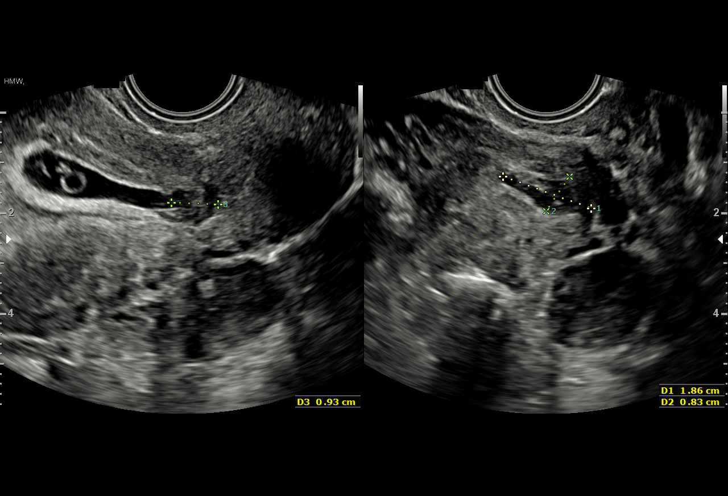
[im 35/42]
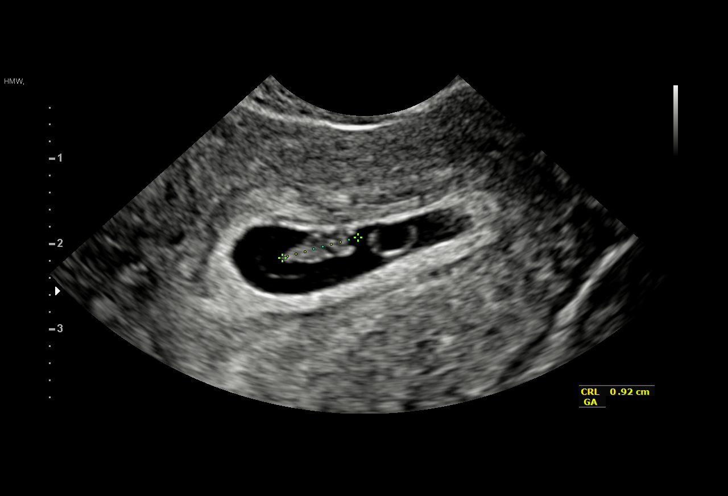
[im 38/42]
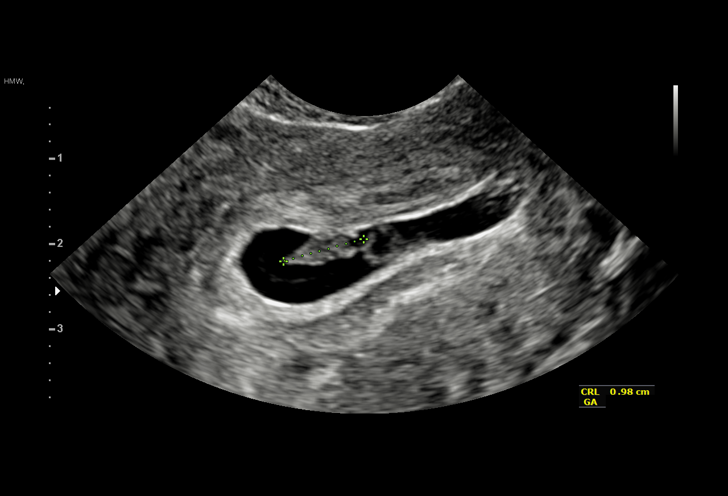
[im 42/42]
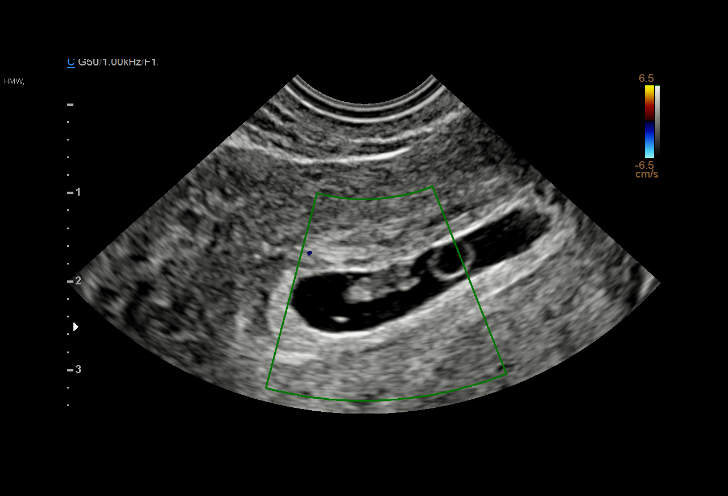

[15 of 28 positions shown; findings below may reference images not displayed]

FINDINGS: Intrauterine gestational sac: Single visualized, but abnormal in
shape and positioned in lower uterine segment.

Yolk sac:  Visualized.

Embryo:  Visualized.

Cardiac Activity: Not visualized.

CRL:   9.4 mm   6 w 6 d                  US EDC: August 01, 2018.

Subchorionic hemorrhage:  Small subchronic hemorrhage is noted.

Maternal uterus/adnexae: Ovaries are unremarkable.
IMPRESSION: Crown-rump length measures 9.4 mm with fetal cardiac activity no
longer visualized, although it was present on prior exam. Findings
meet definitive criteria for failed pregnancy. This follows SRU
consensus guidelines: Diagnostic Criteria for Nonviable Pregnancy
Early in the First Trimester. N Engl J Med 9045;[DATE].

## 2019-02-21 IMAGING — US US OB TRANSVAGINAL
1 series · 13 of 13 positions shown · non-contrast
Comparison: Obstetric ultrasound yesterday at 722 hours

CLINICAL DATA: Spontaneous abortion.

EXAM:
TRANSVAGINAL OB ULTRASOUND
TECHNIQUE: Transvaginal ultrasound was performed for complete evaluation of the
gestation as well as the maternal uterus, adnexal regions, and
pelvic cul-de-sac.

[Series 1: us ob transvaginal · 13 acquisitions, 13 frames shown]
[im 1/13]
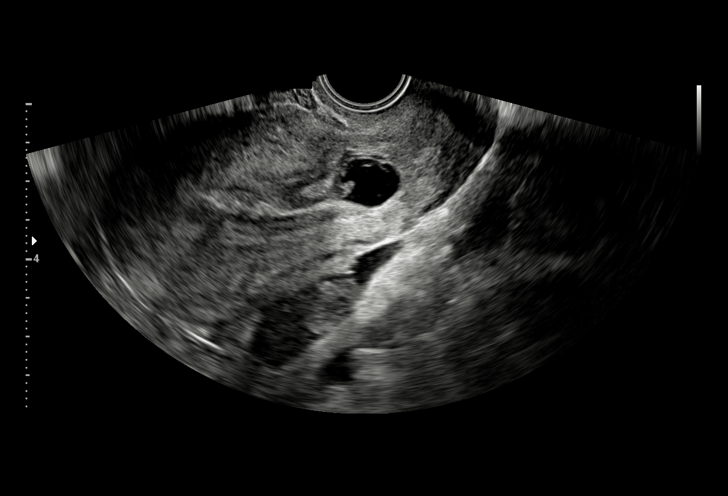
[im 2/13]
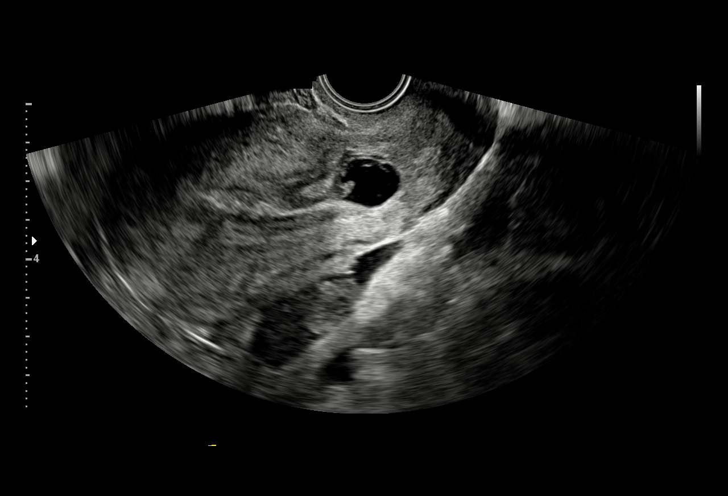
[im 3/13]
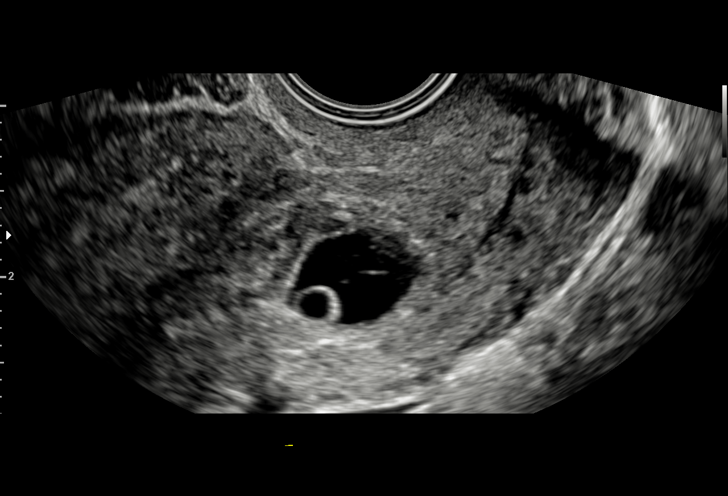
[im 4/13]
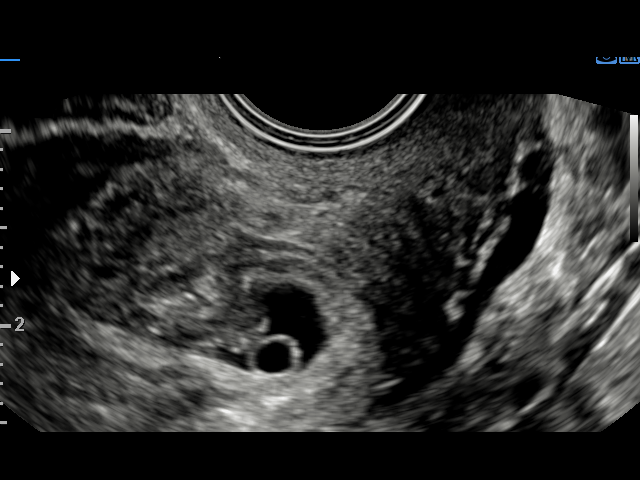
[im 5/13]
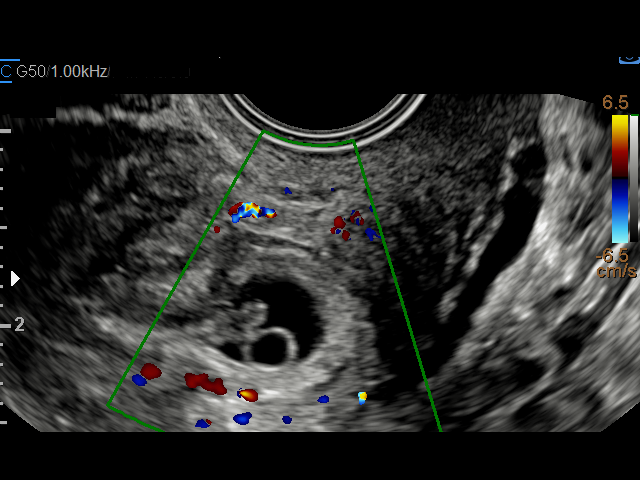
[im 6/13]
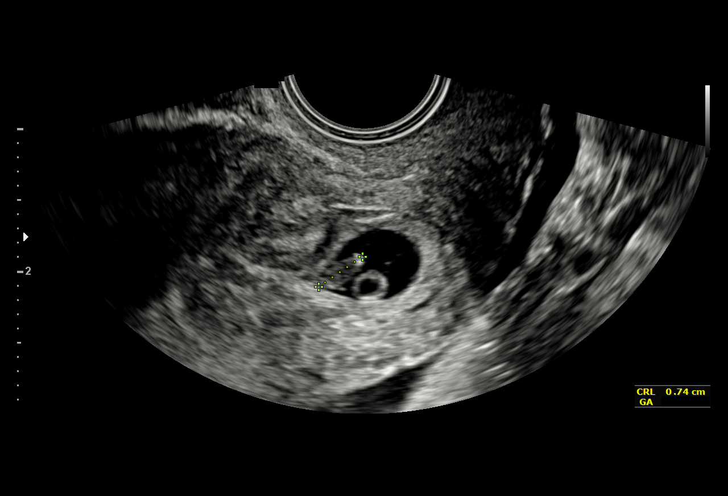
[im 7/13]
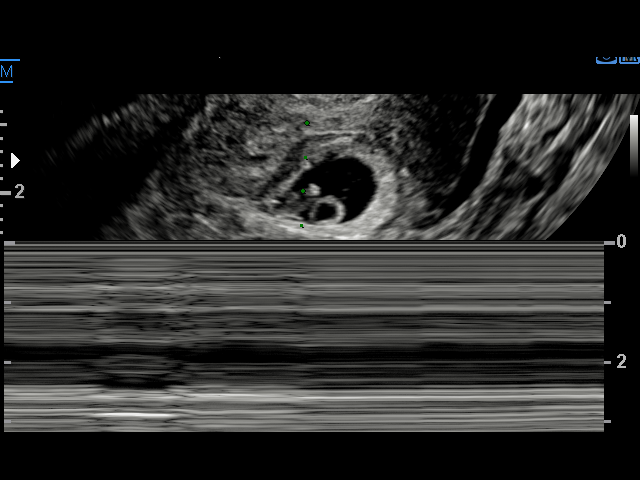
[im 8/13]
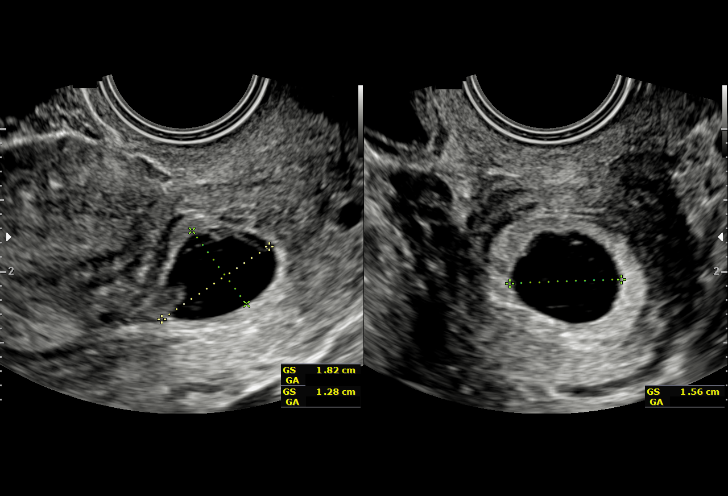
[im 9/13]
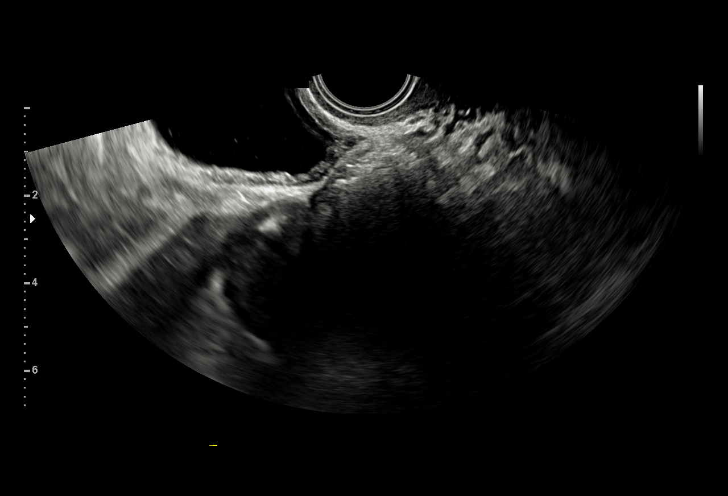
[im 10/13]
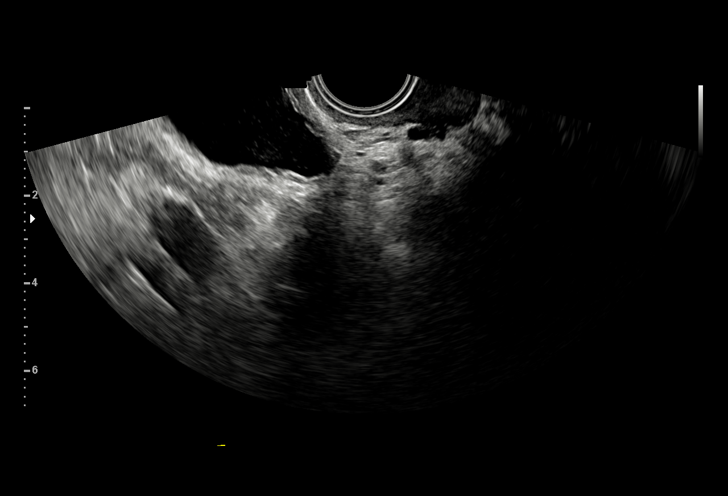
[im 11/13]
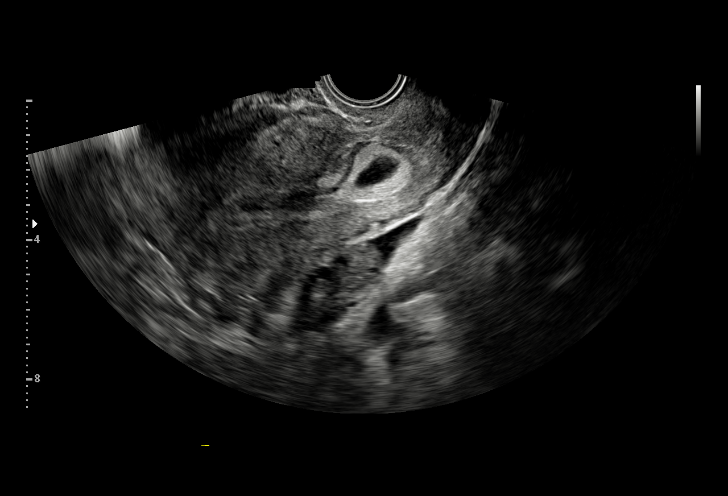
[im 12/13]
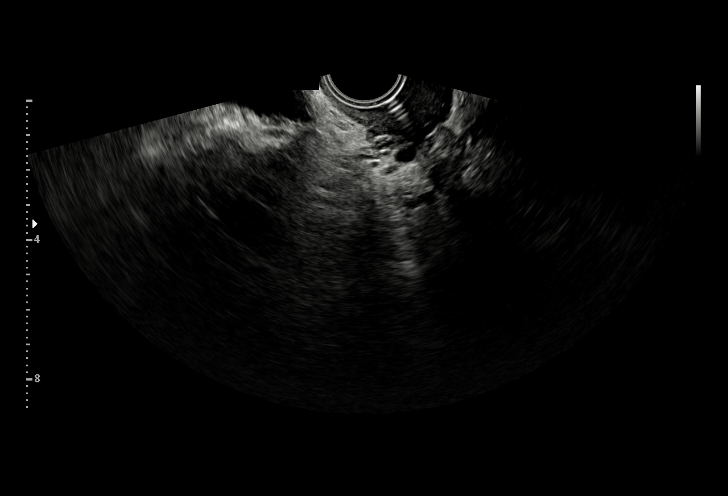
[im 13/13]
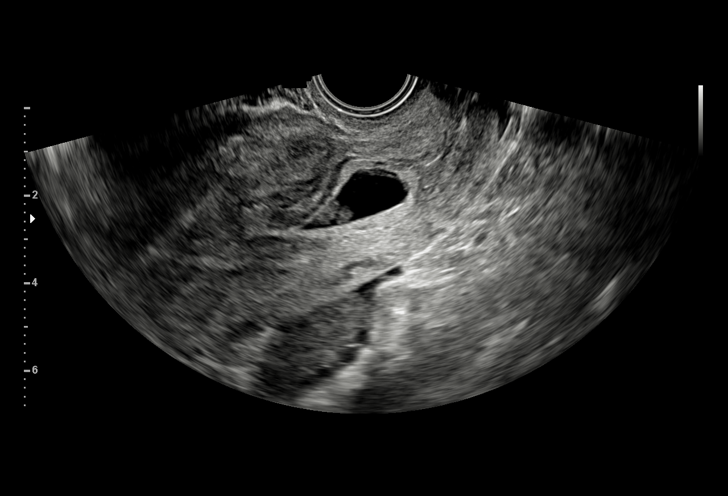

[13 of 13 positions shown; findings below may reference images not displayed]

FINDINGS: Intrauterine gestational sac: Present in the lower uterine segment.

Yolk sac:  Visualized.

Embryo:  Visualized.

Cardiac Activity: Not Visualized.

CRL:   15.5 mm   6 w 2 d

Subchorionic hemorrhage:  None visualized.

Maternal uterus/adnexae: Left ovary is normal. Right ovary better
assessed on exam yesterday. No pelvic free fluid.
IMPRESSION: Failed pregnancy. Gestational sac containing a yolk sac and fetal
pole in the lower uterine segment with absent cardiac activity. The
gestational sac is lower in the endometrial canal than yesterday's
exam.
